# Patient Record
Sex: Female | Born: 1995 | Race: White | Hispanic: No | Marital: Single | Smoking: Current every day smoker
Health system: Southern US, Community
[De-identification: ages and names within clinical notes are randomized; demographics above are authoritative.]

## PROBLEM LIST (undated history)

## (undated) ENCOUNTER — Inpatient Hospital Stay: Payer: Self-pay

## (undated) DIAGNOSIS — F419 Anxiety disorder, unspecified: Secondary | ICD-10-CM

## (undated) DIAGNOSIS — F319 Bipolar disorder, unspecified: Secondary | ICD-10-CM

## (undated) DIAGNOSIS — F329 Major depressive disorder, single episode, unspecified: Secondary | ICD-10-CM

## (undated) DIAGNOSIS — G43909 Migraine, unspecified, not intractable, without status migrainosus: Secondary | ICD-10-CM

## (undated) DIAGNOSIS — F32A Depression, unspecified: Secondary | ICD-10-CM

## (undated) HISTORY — PX: NO PAST SURGERIES: SHX2092

## (undated) HISTORY — PX: OTHER SURGICAL HISTORY: SHX169

## (undated) HISTORY — DX: Migraine, unspecified, not intractable, without status migrainosus: G43.909

## (undated) HISTORY — PX: INTRAUTERINE DEVICE (IUD) INSERTION: SHX5877

---
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
## 2014-02-24 DIAGNOSIS — G43709 Chronic migraine without aura, not intractable, without status migrainosus: Secondary | ICD-10-CM | POA: Diagnosis present

## 2014-02-24 DIAGNOSIS — IMO0002 Reserved for concepts with insufficient information to code with codable children: Secondary | ICD-10-CM | POA: Diagnosis present

## 2014-08-31 NOTE — Consult Note (Signed)
Referral Information:   Reason for Referral Abnormal thyroid function testing and family history of thyroid cancer    Referring Physician [facility]    Prenatal Hx Ms. Cliffton AstersWhite is 19 year old G2P0010 with EDC of 09/14/2012 (16 3/7 weeks) who I was asked to see secondary to having abnormal TFTs. These tests were done because the patient has been having some light-headedness. Of note, the patient's mother had thyroid cancer. On 03/07/2012: TSH = 0.328 (0.450-4.500) reported as low Thryoxine (T4) = 12.1 (4.5-12.0) reported as high (of note, this was total T4) T3 Uptake = 18% (23-35%) reported as low Free Thryroxine Index = 2.2 (1.2-4.9) reported as normal   Home Medications: Medication Instructions Status  Zofran 8 mg oral tablet tab(s) orally , As Needed- for Nausea, Vomiting  Active   Allergies:   Latex: Blisters  Vital Signs/Notes:  Nursing Vital Signs: **Vital Signs.:   [id number]:   18-Nov-13 08:53   Vital Signs Type Routine   Temperature Temperature (F) 97.2   Celsius 36.2   Pulse Pulse 93   Respirations Respirations 16   Systolic BP Systolic BP 108   Diastolic BP (mmHg) Diastolic BP (mmHg) 64   Mean BP 78   Impression/Recommendations:   Impression Normal thyroid testing for pregnancy. With pregnancy, thyroid binding globulin (TBG) increases. With this, a pregnant women will make more total T4, so that free T4 is normal. Ms. Cliffton AstersWhite has a slightly increased total T4 and a depressed T3 Uptake. T3 Uptake is an inverse measurement of TBG. As TBG increases T3 Uptake decreases. The free thyroxine index is the result of the the interaction and total T4 and T3 Uptake. It is a reflection of free T4. Ms. Cliffton AstersWhite has a normal free thyroxine index.  Also, in pregnancy, the TSH can be slightly depressed. Alpha HCG and alpha TSH are the same molecule. Alpha HCG has a little stimulating effect on the thyroid, so we can see TSH be slightly depressed in pregnancy.    Recommendations No recommendations are  needed for this issue.     Total Time Spent with Patient 15 minutes    >50% of visit spent in couseling/coordination of care no    Office Use Only [address]  Level 1 (15min) NEW office consult prob focused   Coding Description: MATERNAL CONDITIONS/HISTORY INDICATION(S).   Thyroid dysfunction in pregnancy.  Electronic Signatures: Marcelino ScotBrancazio, Doren Kaspar (MD)  (Signed [phone]-Nov-13 09:42)  Authored: Referral, Home Medications, Allergies, Vital Signs/Notes, Impression, Billing, Coding Description   Last Updated: 18-Nov-13 09:42 by Marcelino ScotBrancazio, Maelani Yarbro (MD)
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
## 2015-10-06 ENCOUNTER — Encounter: Payer: Self-pay | Admitting: Emergency Medicine

## 2015-10-06 ENCOUNTER — Ambulatory Visit
Admission: EM | Admit: 2015-10-06 | Discharge: 2015-10-06 | Disposition: A | Discharge status: Home / Self Care | Payer: Worker's Compensation | Attending: Emergency Medicine | Admitting: Emergency Medicine

## 2015-10-06 ENCOUNTER — Ambulatory Visit (INDEPENDENT_AMBULATORY_CARE_PROVIDER_SITE_OTHER): Payer: Worker's Compensation

## 2015-10-06 DIAGNOSIS — S8392XA Sprain of unspecified site of left knee, initial encounter: Secondary | ICD-10-CM | POA: Diagnosis not present

## 2015-10-06 MED ORDER — HYDROCODONE-ACETAMINOPHEN 5-325 MG PO TABS: 1.0000 | ORAL_TABLET | ORAL | Status: DC | PRN

## 2015-10-06 MED ORDER — IBUPROFEN 800 MG PO TABS: 800.0000 mg | ORAL_TABLET | Freq: Three times a day (TID) | ORAL | Status: DC | PRN

## 2015-10-06 NOTE — ED Notes (Signed)
Pt reports tripped and fell while working Tuesday and continues to have right knee pain and swelling

## 2015-10-06 NOTE — Discharge Instructions (Signed)
Follow up with Occupational Health within the next day or two. Go to the ER for the signs and symptoms we discussed.

## 2015-10-06 NOTE — ED Provider Notes (Addendum)
HPI  SUBJECTIVE:  Laura Pitts is a 20 y.o. female who presents with throbbing, sharp, constant right knee pain. Patient states that she her foot slipped out from underneath her 2 days ago, she hyperextended her knee. States that she twisted to the right. She heard her knee "pop", but it was not immediately followed by pain or swelling until later. No redness, numbness, tingling, bruising, deformity. Symptoms are worse with weightbearing, better with rest, nonweightbearing, ibuprofen, knee brace. She has been wearing her fianc's knee brace, tried 2 tabs of Tylenol, 400 mg ibuprofen. No antipyretic in the past 6-8 hours. No previous history of injury to this knee. She has past medical history of left patellar dislocation, no history of diabetes, hypertension. LMP: 5/21. PMD: Dr. Fulton Mole at [facility] primary care. This is a worker's comp injury.  No past medical history on file.  No past surgical history on file.  No family history on file.  Social History  Substance Use Topics  . Smoking status: Current Every Day Smoker -- 0.50 packs/day    Types: Cigarettes  . Smokeless tobacco: None  . Alcohol Use: No    No current facility-administered medications for this encounter.  Current outpatient prescriptions:  .  sertraline (ZOLOFT) 25 MG tablet, Take 25 mg by mouth daily., Disp: , Rfl:  .  HYDROcodone-acetaminophen (NORCO/VICODIN) 5-325 MG tablet, Take 1-2 tablets by mouth every 4 (four) hours as needed for moderate pain., Disp: 20 tablet, Rfl: 0 .  ibuprofen (ADVIL,MOTRIN) 800 MG tablet, Take 1 tablet (800 mg total) by mouth every 8 (eight) hours as needed., Disp: 30 tablet, Rfl: 0  Allergies  Allergen Reactions  . Latex      ROS  As noted in HPI.   Physical Exam  BP 115/74 mmHg  Pulse 76  Temp(Src) 97.8 F (36.6 C) (Tympanic)  Resp 16  Ht  (1.651 m)  Wt 150 lb (68.04 kg)  BMI 24.96 kg/m2  SpO2 99%  LMP 10/02/2015  Constitutional: Well developed, well nourished,  no acute distress Eyes:  EOMI, conjunctiva normal bilaterally HENT: Normocephalic, atraumatic,mucus membranes moist Respiratory: Normal inspiratory effort Cardiovascular: Normal rate GI: nondistended skin: No rash, skin intact Musculoskeletal: R Knee ROM baseline for PT,  Flexion/extension  Intact, Patella NT, Patellar apprehension test negative, Patellar tendon tender, Medial joint NT, Lateral joint  tender, Popliteal region NT, Lachman's stable, Varus LCL stress testing stable, Valgus MCL stress testing stable, McMurray's testing  abnormal, distal NVI with intact baseline sensation / motor / pulse distal to knee affected extremity. No erythema, bruising, effusion. Sensation distally grossly intact. PT pulses 2+ equal bilaterally Neurologic: Alert & oriented x 3, no focal neuro deficits Psychiatric: Speech and behavior appropriate   ED Course   Medications - No data to display  Orders Placed This Encounter  Procedures  . DG Knee Complete 4 Views Right    Standing Status: Standing     Number of Occurrences: 1     Standing Expiration Date:     Order Specific Question:  Reason for Exam (SYMPTOM  OR DIAGNOSIS REQUIRED)    Answer:  r/o fx dislocation effusion  . Ambulatory referral to Occupational Therapy    Referral Priority:  Urgent    Referral Type:  Occupational Therapy    Referral Reason:  Specialty Services Required    Requested Specialty:  Occupational Therapy    Number of Visits Requested:  1    No results found for this or any previous visit (from the past  24 hour(s)). Dg Knee Complete 4 Views Right  10/06/2015  CLINICAL DATA:  Slipped and fell 3 days ago, twisted knee EXAM: RIGHT KNEE - COMPLETE 4+ VIEW COMPARISON:  04/04/2011 FINDINGS: Four views of the right knee submitted. No acute fracture or subluxation. There is minimal narrowing of medial joint compartment. Small joint effusion. No radiopaque foreign body. IMPRESSION: No acute fracture or subluxation. Minimal narrowing  of medial joint compartment. Small joint effusion. Electronically Signed   By: Natasha MeadLiviu  Pop M.D.   On: 10/06/2015 13:21    ED Clinical Impression  Knee sprain and strain, left, initial encounter   ED Assessment/Plan  Reviewed imaging independently. No fracture or subluxation. Mild narrowing of medial joint compartment, small joint effusion per radiology. See radiology report for details.   We'll make sure that patient's brace fits her knee, if it doesn't, we will provide appropriately fitting brace. Home with ibuprofen, tramadol, ice, rest. Filled out worker's comp paperwork for limited activity for one week. She is to follow-up with Hassell Halimommy Ann Moore at occupational health.  Discussed imaging, MDM, plan and followup with patient. Discussed sn/sx that should prompt return to the ED. Patient agrees with plan.   *This clinic note was created using Dragon dictation software. Therefore, there may be occasional mistakes despite careful proofreading.  ?   Domenick GongAshley Osmin Welz, MD 10/06/15 1348  Domenick GongAshley Lorie Cleckley, MD 10/06/15 [phone]

## 2015-11-16 ENCOUNTER — Other Ambulatory Visit: Payer: Self-pay | Admitting: Family

## 2015-11-16 DIAGNOSIS — M2240 Chondromalacia patellae, unspecified knee: Secondary | ICD-10-CM | POA: Insufficient documentation

## 2015-12-16 LAB — HM PAP SMEAR

## 2016-01-13 ENCOUNTER — Encounter: Payer: Self-pay | Admitting: Emergency Medicine

## 2016-01-13 ENCOUNTER — Emergency Department
Admission: EM | Admit: 2016-01-13 | Discharge: 2016-01-14 | Disposition: A | Discharge status: Home / Self Care | Payer: Medicaid Other | Attending: Student in an Organized Health Care Education/Training Program | Admitting: Student in an Organized Health Care Education/Training Program

## 2016-01-13 DIAGNOSIS — F32A Depression, unspecified: Secondary | ICD-10-CM

## 2016-01-13 DIAGNOSIS — F329 Major depressive disorder, single episode, unspecified: Secondary | ICD-10-CM | POA: Insufficient documentation

## 2016-01-13 DIAGNOSIS — Z79899 Other long term (current) drug therapy: Secondary | ICD-10-CM | POA: Diagnosis not present

## 2016-01-13 DIAGNOSIS — F17210 Nicotine dependence, cigarettes, uncomplicated: Secondary | ICD-10-CM | POA: Insufficient documentation

## 2016-01-13 DIAGNOSIS — Z046 Encounter for general psychiatric examination, requested by authority: Secondary | ICD-10-CM | POA: Diagnosis present

## 2016-01-13 DIAGNOSIS — F1290 Cannabis use, unspecified, uncomplicated: Secondary | ICD-10-CM | POA: Diagnosis not present

## 2016-01-13 LAB — CBC
HCT: 41.9 % (ref 35.0–47.0)
Hemoglobin: 14.7 g/dL (ref 12.0–16.0)
MCH: 31.5 pg (ref 26.0–34.0)
MCHC: 35.1 g/dL (ref 32.0–36.0)
MCV: 89.7 fL (ref 80.0–100.0)
Platelets: 252 10*3/uL (ref 150–440)
RBC: 4.67 MIL/uL (ref 3.80–5.20)
RDW: 13.2 % (ref 11.5–14.5)
WBC: 8.9 10*3/uL (ref 3.6–11.0)

## 2016-01-13 LAB — COMPREHENSIVE METABOLIC PANEL
ALT: 11 U/L — ABNORMAL LOW (ref 14–54)
AST: 15 U/L (ref 15–41)
Albumin: 4.7 g/dL (ref 3.5–5.0)
Alkaline Phosphatase: 43 U/L (ref 38–126)
Anion gap: 6 (ref 5–15)
BUN: 11 mg/dL (ref 6–20)
CO2: 29 mmol/L (ref 22–32)
Calcium: 9.1 mg/dL (ref 8.9–10.3)
Chloride: 104 mmol/L (ref 101–111)
Creatinine, Ser: 0.79 mg/dL (ref 0.44–1.00)
GFR calc Af Amer: >60 mL/min (ref >60)
GFR calc non Af Amer: >60 mL/min (ref >60)
Glucose, Bld: 108 mg/dL — ABNORMAL HIGH (ref 65–99)
Potassium: 3.4 mmol/L — ABNORMAL LOW (ref 3.5–5.1)
Sodium: 139 mmol/L (ref 135–145)
Total Bilirubin: 0.2 mg/dL — ABNORMAL LOW (ref 0.3–1.2)
Total Protein: 7.9 g/dL (ref 6.5–8.1)

## 2016-01-13 LAB — ETHANOL: Alcohol, Ethyl (B): <5 mg/dL (ref <5)

## 2016-01-13 LAB — URINE DRUG SCREEN, QUALITATIVE (ARMC ONLY)
Amphetamines, Ur Screen: NOT DETECTED
Barbiturates, Ur Screen: POSITIVE — AB
Benzodiazepine, Ur Scrn: POSITIVE — AB
Cannabinoid 50 Ng, Ur ~~LOC~~: POSITIVE — AB
Cocaine Metabolite,Ur ~~LOC~~: NOT DETECTED
MDMA (Ecstasy)Ur Screen: NOT DETECTED
Methadone Scn, Ur: NOT DETECTED
Opiate, Ur Screen: POSITIVE — AB
Phencyclidine (PCP) Ur S: NOT DETECTED
Tricyclic, Ur Screen: NOT DETECTED

## 2016-01-13 LAB — POCT PREGNANCY, URINE: Preg Test, Ur: NEGATIVE

## 2016-01-13 LAB — SALICYLATE LEVEL: Salicylate Lvl: <4 mg/dL (ref 2.8–30.0)

## 2016-01-13 LAB — ACETAMINOPHEN LEVEL: Acetaminophen (Tylenol), Serum: <10 ug/mL — ABNORMAL LOW (ref 10–30)

## 2016-01-13 NOTE — ED Provider Notes (Signed)
[facility] Emergency Department Provider Note    First MD Initiated Contact with Patient 01/13/16 2010     (approximate)  I have reviewed the triage vital signs and the nursing notes.   HISTORY  Chief Complaint Psychiatric Evaluation    HPI Laura Pitts is a 20 y.o. female who presents due to severe depression. Patient states that she feels that she's not being a good mother 89 or 31-year-old. Recently broke up with her boyfriend has been living out of her car. States that she has thoughts of "wanting to end it." But denies any plan on how she would hurt herself or kill herself. Denies any audio or visual hallucinations. Denies any recent changes to her medications. Is currently asking for pain medication for her low back pain and knee pain.   History reviewed. No pertinent past medical history.  There are no active problems to display for this patient.   History reviewed. No pertinent surgical history.  Prior to Admission medications   Medication Sig Start Date End Date Taking? Authorizing Provider  HYDROcodone-acetaminophen (NORCO/VICODIN) 5-325 MG tablet Take 1-2 tablets by mouth every 4 (four) hours as needed for moderate pain. 10/06/15   Domenick Gong, MD  ibuprofen (ADVIL,MOTRIN) 800 MG tablet Take 1 tablet (800 mg total) by mouth every 8 (eight) hours as needed. 10/06/15   Domenick Gong, MD  sertraline (ZOLOFT) 25 MG tablet Take 25 mg by mouth daily.    Historical Provider, MD    Allergies Latex  History reviewed. No pertinent family history.  Social History Social History  Substance Use Topics  . Smoking status: Current Every Day Smoker    Packs/day: 0.50    Types: Cigarettes  . Smokeless tobacco: Never Used  . Alcohol use Yes    Review of Systems Patient denies headaches, rhinorrhea, blurry vision, numbness, shortness of breath, chest pain, edema, cough, abdominal pain, nausea, vomiting, diarrhea, dysuria, fevers, rashes or  hallucinations unless otherwise stated above in HPI. ____________________________________________   PHYSICAL EXAM:  VITAL SIGNS: Vitals:   01/13/16 1945  BP: 130/72  Pulse: 94  Resp: 16  Temp: 98.9 F (37.2 C)    Constitutional: Alert and oriented. Well appearing and in no acute distress. Eyes: Conjunctivae are normal. PERRL. EOMI. Head: Atraumatic. Nose: No congestion/rhinnorhea. Mouth/Throat: Mucous membranes are moist.  Oropharynx non-erythematous. Neck: No stridor. Painless ROM. No cervical spine tenderness to palpation Hematological/Lymphatic/Immunilogical: No cervical lymphadenopathy. Cardiovascular: Normal rate, regular rhythm. Grossly normal heart sounds.  Good peripheral circulation. Respiratory: Normal respiratory effort.  No retractions. Lungs CTAB. Gastrointestinal: Soft and nontender. No distention. No abdominal bruits. No CVA tenderness. Genitourinary:  Musculoskeletal: No lower extremity tenderness nor edema.  No joint effusions. Neurologic:  Normal speech and language. No gross focal neurologic deficits are appreciated. No gait instability. Skin:  Skin is warm, dry and intact. No rash noted. Psychiatric: Mood and affect are normal. Speech and behavior are normal.  ____________________________________________   LABS (all labs ordered are listed, but only abnormal results are displayed)  Results for orders placed or performed during the hospital encounter of 01/13/16 (from the past 24 hour(s))  Comprehensive metabolic panel     Status: Abnormal   Collection Time: 01/13/16  7:51 PM  Result Value Ref Range   Sodium 139 135 - 145 mmol/L   Potassium 3.4 (L) 3.5 - 5.1 mmol/L   Chloride 104 101 - 111 mmol/L   CO2 29 22 - 32 mmol/L   Glucose, Bld 108 (H) 65 -  99 mg/dL   BUN 11 6 - 20 mg/dL   Creatinine, Ser 2.840.79 0.44 - 1.00 mg/dL   Calcium 9.1 8.9 - 13.210.3 mg/dL   Total Protein 7.9 6.5 - 8.1 g/dL   Albumin 4.7 3.5 - 5.0 g/dL   AST 15 15 - 41 U/L   ALT 11 (L)  14 - 54 U/L   Alkaline Phosphatase 43 38 - 126 U/L   Total Bilirubin 0.2 (L) 0.3 - 1.2 mg/dL   GFR calc non Af Amer >60 >60 mL/min   GFR calc Af Amer >60 >60 mL/min   Anion gap 6 5 - 15  Ethanol     Status: None   Collection Time: 01/13/16  7:51 PM  Result Value Ref Range   Alcohol, Ethyl (B) <5 <5 mg/dL  Salicylate level     Status: None   Collection Time: 01/13/16  7:51 PM  Result Value Ref Range   Salicylate Lvl <4.0 2.8 - 30.0 mg/dL  Acetaminophen level     Status: Abnormal   Collection Time: 01/13/16  7:51 PM  Result Value Ref Range   Acetaminophen (Tylenol), Serum <10 (L) 10 - 30 ug/mL  cbc     Status: None   Collection Time: 01/13/16  7:51 PM  Result Value Ref Range   WBC 8.9 3.6 - 11.0 K/uL   RBC 4.67 3.80 - 5.20 MIL/uL   Hemoglobin 14.7 12.0 - 16.0 g/dL   HCT 44.041.9 10.235.0 - 72.547.0 %   MCV 89.7 80.0 - 100.0 fL   MCH 31.5 26.0 - 34.0 pg   MCHC 35.1 32.0 - 36.0 g/dL   RDW 36.613.2 44.011.5 - 34.714.5 %   Platelets 252 150 - 440 K/uL  Urine Drug Screen, Qualitative     Status: Abnormal   Collection Time: 01/13/16  7:52 PM  Result Value Ref Range   Tricyclic, Ur Screen NONE DETECTED NONE DETECTED   Amphetamines, Ur Screen NONE DETECTED NONE DETECTED   MDMA (Ecstasy)Ur Screen NONE DETECTED NONE DETECTED   Cocaine Metabolite,Ur Highfill NONE DETECTED NONE DETECTED   Opiate, Ur Screen POSITIVE (A) NONE DETECTED   Phencyclidine (PCP) Ur S NONE DETECTED NONE DETECTED   Cannabinoid 50 Ng, Ur Dana POSITIVE (A) NONE DETECTED   Barbiturates, Ur Screen POSITIVE (A) NONE DETECTED   Benzodiazepine, Ur Scrn POSITIVE (A) NONE DETECTED   Methadone Scn, Ur NONE DETECTED NONE DETECTED  Pregnancy, urine POC     Status: None   Collection Time: 01/13/16  8:06 PM  Result Value Ref Range   Preg Test, Ur NEGATIVE NEGATIVE   ____________________________________________  ____________________________  RADIOLOGY   ____________________________________________   PROCEDURES  Procedure(s) performed:  none    Critical Care performed: no ____________________________________________   INITIAL IMPRESSION / ASSESSMENT AND PLAN / ED COURSE  Pertinent labs & imaging results that were available during my care of the patient were reviewed by me and considered in my medical decision making (see chart for details).  DDX: Psychosis, delirium, medication effect, noncompliance, polysubstance abuse, Si, Hi, depression    Laura Pitts is a 20 y.o. who presents to the ED with for evaluation of severe depression.  Patient has psych history of depression.  Laboratory testing was ordered to evaluation for underlying electrolyte derangement or signs of underlying organic pathology to explain today's presentation.  + polysubstance abuse. Based on history and physical and laboratory evaluation, it appears that the patient's presentation is 2/2 underlying psychiatric disorder and Pitts require further evaluation and management by  inpatient psychiatry.  Patient was not made an IVC as she does demonstrate any active SI or Hi.  Pitts have patient evaluated by associated for any medication recommendations.  Patient signed out to oncoming physician Dr. Zenda Alpers   Clinical Course     ____________________________________________   FINAL CLINICAL IMPRESSION(S) / ED DIAGNOSES  Final diagnoses:  Depression      NEW MEDICATIONS STARTED DURING THIS VISIT:  New Prescriptions   No medications on file     Note:  This document was prepared using Dragon voice recognition software and may include unintentional dictation errors.    Willy Eddy, MD 01/14/16 [phone]

## 2016-01-13 NOTE — BH Assessment (Signed)
Assessment Note  Laura Pitts is an 20 y.o. female presenting to the ED with concerns of depression and suicidal ideations without intent.  Patient reports being feeling stressed and overwhelmed but did not wish to elaborate.  Patient did not admit to drug use but her UDS was positive for Benzos, opiates, cannabis and barbiturates. She denies any auditory/visual hallucinations.  Diagnosis: Depression  Past Medical History: History reviewed. No pertinent past medical history.  History reviewed. No pertinent surgical history.  Family History: History reviewed. No pertinent family history.  Social History:  reports that she has been smoking Cigarettes.  She has been smoking about 0.50 packs per day. She has never used smokeless tobacco. She reports that she drinks alcohol. She reports that she uses drugs, including Marijuana.  Additional Social History:  Alcohol / Drug Use History of alcohol / drug use?: No history of alcohol / drug abuse (Pt denies) Negative Consequences of Use: Personal relationships  CIWA: CIWA-Ar BP: 130/72 Pulse Rate: 94 COWS:    Allergies:  Allergies  Allergen Reactions  . Latex   . Venlafaxine Other (See Comments)    Too tired    Home Medications:  (Not in a hospital admission)  OB/GYN Status:  Patient's last menstrual period was 01/04/2016.  General Assessment Data Admission Status: Voluntary           Risk to self with the past 6 months Is patient at risk for suicide?: Yes                               Abuse/Neglect Assessment (Assessment to be complete while patient is alone) Physical Abuse: Denies Verbal Abuse: Denies Sexual Abuse: Denies Exploitation of patient/patient's resources: Denies Self-Neglect: Denies Values / Beliefs Cultural Requests During Hospitalization: None Spiritual Requests During Hospitalization: None Consults Spiritual Care Consult Needed: No Social Work Consult Needed: No             Disposition:     On Site Evaluation by:   Reviewed with Physician:    Artist Beachoxana C Phillip Sandler 01/13/2016 11:04 PM

## 2016-01-13 NOTE — ED Triage Notes (Signed)
Pt states she has felt depressed for several days. Pt states "sort of kind of" when asked if she feels suicidal. Pt tearful in triage. Pt denies HI.

## 2016-01-14 NOTE — ED Notes (Signed)
SOC att  

## 2016-09-20 ENCOUNTER — Encounter: Payer: Self-pay | Admitting: Emergency Medicine

## 2016-09-20 ENCOUNTER — Encounter: Payer: Self-pay | Admitting: Psychiatry

## 2016-09-20 ENCOUNTER — Emergency Department
Admission: EM | Admit: 2016-09-20 | Discharge: 2016-09-20 | Disposition: A | Discharge status: Other Acute Inpatient Hospital | Payer: Medicaid Other | Attending: Emergency Medicine | Admitting: Emergency Medicine

## 2016-09-20 ENCOUNTER — Inpatient Hospital Stay
Admission: RE | Admit: 2016-09-20 | Discharge: 2016-09-24 | DRG: 885 | Disposition: A | Discharge status: Home / Self Care | Payer: Medicaid Other | Source: Intra-hospital | Attending: Psychiatry | Admitting: Psychiatry

## 2016-09-20 DIAGNOSIS — F4310 Post-traumatic stress disorder, unspecified: Secondary | ICD-10-CM

## 2016-09-20 DIAGNOSIS — G43909 Migraine, unspecified, not intractable, without status migrainosus: Secondary | ICD-10-CM | POA: Diagnosis present

## 2016-09-20 DIAGNOSIS — Z79899 Other long term (current) drug therapy: Secondary | ICD-10-CM | POA: Diagnosis not present

## 2016-09-20 DIAGNOSIS — G4700 Insomnia, unspecified: Secondary | ICD-10-CM | POA: Diagnosis present

## 2016-09-20 DIAGNOSIS — R45851 Suicidal ideations: Secondary | ICD-10-CM | POA: Diagnosis present

## 2016-09-20 DIAGNOSIS — F4325 Adjustment disorder with mixed disturbance of emotions and conduct: Secondary | ICD-10-CM | POA: Diagnosis not present

## 2016-09-20 DIAGNOSIS — G8929 Other chronic pain: Secondary | ICD-10-CM | POA: Diagnosis present

## 2016-09-20 DIAGNOSIS — E538 Deficiency of other specified B group vitamins: Secondary | ICD-10-CM | POA: Diagnosis present

## 2016-09-20 DIAGNOSIS — F319 Bipolar disorder, unspecified: Secondary | ICD-10-CM | POA: Insufficient documentation

## 2016-09-20 DIAGNOSIS — F17210 Nicotine dependence, cigarettes, uncomplicated: Secondary | ICD-10-CM | POA: Diagnosis present

## 2016-09-20 DIAGNOSIS — G43709 Chronic migraine without aura, not intractable, without status migrainosus: Secondary | ICD-10-CM | POA: Diagnosis present

## 2016-09-20 DIAGNOSIS — F17200 Nicotine dependence, unspecified, uncomplicated: Secondary | ICD-10-CM

## 2016-09-20 DIAGNOSIS — F332 Major depressive disorder, recurrent severe without psychotic features: Secondary | ICD-10-CM | POA: Diagnosis present

## 2016-09-20 DIAGNOSIS — IMO0002 Reserved for concepts with insufficient information to code with codable children: Secondary | ICD-10-CM | POA: Diagnosis present

## 2016-09-20 HISTORY — DX: Bipolar disorder, unspecified: F31.9

## 2016-09-20 HISTORY — DX: Major depressive disorder, single episode, unspecified: F32.9

## 2016-09-20 HISTORY — DX: Anxiety disorder, unspecified: F41.9

## 2016-09-20 HISTORY — DX: Depression, unspecified: F32.A

## 2016-09-20 LAB — URINE DRUG SCREEN, QUALITATIVE (ARMC ONLY)
Amphetamines, Ur Screen: NOT DETECTED
Barbiturates, Ur Screen: NOT DETECTED
Benzodiazepine, Ur Scrn: NOT DETECTED
Cannabinoid 50 Ng, Ur ~~LOC~~: POSITIVE — AB
Cocaine Metabolite,Ur ~~LOC~~: NOT DETECTED
MDMA (Ecstasy)Ur Screen: NOT DETECTED
Methadone Scn, Ur: NOT DETECTED
Opiate, Ur Screen: NOT DETECTED
Phencyclidine (PCP) Ur S: NOT DETECTED
Tricyclic, Ur Screen: POSITIVE — AB

## 2016-09-20 LAB — COMPREHENSIVE METABOLIC PANEL
ALT: 14 U/L (ref 14–54)
AST: 19 U/L (ref 15–41)
Albumin: 4.1 g/dL (ref 3.5–5.0)
Alkaline Phosphatase: 49 U/L (ref 38–126)
Anion gap: 5 (ref 5–15)
BUN: 9 mg/dL (ref 6–20)
CO2: 28 mmol/L (ref 22–32)
Calcium: 9.3 mg/dL (ref 8.9–10.3)
Chloride: 108 mmol/L (ref 101–111)
Creatinine, Ser: 0.73 mg/dL (ref 0.44–1.00)
GFR calc Af Amer: >60 mL/min (ref >60)
GFR calc non Af Amer: >60 mL/min (ref >60)
Glucose, Bld: 101 mg/dL — ABNORMAL HIGH (ref 65–99)
Potassium: 3.7 mmol/L (ref 3.5–5.1)
Sodium: 141 mmol/L (ref 135–145)
Total Bilirubin: 0.7 mg/dL (ref 0.3–1.2)
Total Protein: 7.6 g/dL (ref 6.5–8.1)

## 2016-09-20 LAB — SALICYLATE LEVEL: Salicylate Lvl: <7 mg/dL (ref 2.8–30.0)

## 2016-09-20 LAB — CBC
HCT: 41.4 % (ref 35.0–47.0)
Hemoglobin: 14.3 g/dL (ref 12.0–16.0)
MCH: 30.7 pg (ref 26.0–34.0)
MCHC: 34.5 g/dL (ref 32.0–36.0)
MCV: 89 fL (ref 80.0–100.0)
Platelets: 251 10*3/uL (ref 150–440)
RBC: 4.65 MIL/uL (ref 3.80–5.20)
RDW: 12.3 % (ref 11.5–14.5)
WBC: 10.3 10*3/uL (ref 3.6–11.0)

## 2016-09-20 LAB — ETHANOL: Alcohol, Ethyl (B): <5 mg/dL (ref <5)

## 2016-09-20 LAB — ACETAMINOPHEN LEVEL: Acetaminophen (Tylenol), Serum: <10 ug/mL — ABNORMAL LOW (ref 10–30)

## 2016-09-20 MED ORDER — ACETAMINOPHEN 325 MG PO TABS
650.0000 mg | ORAL_TABLET | Freq: Four times a day (QID) | ORAL | Status: DC | PRN
  Administered 2016-09-23: 650 mg via ORAL
  Filled 2016-09-20: qty 2

## 2016-09-20 MED ORDER — MELOXICAM 7.5 MG PO TABS: 15.0000 mg | ORAL_TABLET | Freq: Every day | ORAL | Status: DC

## 2016-09-20 MED ORDER — ACETAMINOPHEN 325 MG PO TABS
650.0000 mg | ORAL_TABLET | Freq: Once | ORAL | Status: AC
  Administered 2016-09-20: 650 mg via ORAL

## 2016-09-20 MED ORDER — LAMOTRIGINE 25 MG PO TABS
50.0000 mg | ORAL_TABLET | Freq: Two times a day (BID) | ORAL | Status: DC
  Administered 2016-09-20 – 2016-09-24 (×8): 50 mg via ORAL
  Filled 2016-09-20 (×8): qty 2

## 2016-09-20 MED ORDER — MELOXICAM 7.5 MG PO TABS
15.0000 mg | ORAL_TABLET | Freq: Every day | ORAL | Status: DC
  Administered 2016-09-21 – 2016-09-24 (×4): 15 mg via ORAL
  Filled 2016-09-20 (×5): qty 2

## 2016-09-20 MED ORDER — VENLAFAXINE HCL ER 75 MG PO CP24: 75.0000 mg | ORAL_CAPSULE | Freq: Every day | ORAL | Status: DC

## 2016-09-20 MED ORDER — VENLAFAXINE HCL ER 75 MG PO CP24
75.0000 mg | ORAL_CAPSULE | Freq: Every day | ORAL | Status: DC
  Administered 2016-09-21: 75 mg via ORAL
  Filled 2016-09-20: qty 1

## 2016-09-20 MED ORDER — AMITRIPTYLINE HCL 25 MG PO TABS
25.0000 mg | ORAL_TABLET | Freq: Every day | ORAL | Status: DC
  Filled 2016-09-20: qty 1

## 2016-09-20 MED ORDER — ACETAMINOPHEN 325 MG PO TABS
650.0000 mg | ORAL_TABLET | Freq: Once | ORAL | Status: DC
  Filled 2016-09-20: qty 2

## 2016-09-20 MED ORDER — MAGNESIUM HYDROXIDE 400 MG/5ML PO SUSP: 30.0000 mL | Freq: Every day | ORAL | Status: DC | PRN

## 2016-09-20 MED ORDER — TRAZODONE HCL 100 MG PO TABS: 100.0000 mg | ORAL_TABLET | Freq: Every evening | ORAL | Status: DC | PRN

## 2016-09-20 MED ORDER — ALUM & MAG HYDROXIDE-SIMETH 200-200-20 MG/5ML PO SUSP: 30.0000 mL | ORAL | Status: DC | PRN

## 2016-09-20 MED ORDER — HYDROXYZINE HCL 25 MG PO TABS: 25.0000 mg | ORAL_TABLET | Freq: Three times a day (TID) | ORAL | Status: DC | PRN

## 2016-09-20 MED ORDER — AMITRIPTYLINE HCL 25 MG PO TABS
25.0000 mg | ORAL_TABLET | Freq: Every day | ORAL | Status: DC
  Administered 2016-09-20 – 2016-09-21 (×2): 25 mg via ORAL
  Filled 2016-09-20 (×2): qty 1

## 2016-09-20 MED ORDER — LAMOTRIGINE 25 MG PO TABS: 50.0000 mg | ORAL_TABLET | Freq: Two times a day (BID) | ORAL | Status: DC

## 2016-09-20 NOTE — ED Notes (Signed)
Pt. Alert and oriented, warm and dry, in no distress. Pt. Denies SI, HI, and AVH. Pt states was having SI earlier but not currently. Pt. Encouraged to let nursing staff know of any concerns or needs.
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
## 2016-09-20 NOTE — ED Notes (Signed)
[facility] Patient in agreement with transfer. Patient denies SI/HI/AVH and pain.

## 2016-09-20 NOTE — ED Notes (Signed)
Patient is sleeping, Patient's boyfriend called and states " oh let her sleep, she needs it' patient without signs of distress.
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
## 2016-09-20 NOTE — ED Notes (Signed)
Dr. Clapacs talking with Patient.  

## 2016-09-20 NOTE — ED Notes (Signed)
[facility] by Dr. Toni Amendlapacs.  Attending Physician will be Dr. Ardyth HarpsHernandez.   Patient has been [facility] room 324, by [facility] Charge Nurse [place].   Intake Paper Work has been signed and placed on patient chart.  ER staff is aware of the admission Irving Burton( [facility] ER MD; Toniann FailWendy Patient's Nurse & Mya Patient Access).

## 2016-09-20 NOTE — ED Notes (Signed)
Patient refused snack.  

## 2016-09-20 NOTE — ED Notes (Signed)
Patient offered dinner tray and she states she wanted to wait until later, beverage given to Patient./

## 2016-09-20 NOTE — ED Triage Notes (Signed)
Pt admits to SI with plan of overdosing with pills. Denies HI or hallucinations. Has psych hx. Cooperative and calm in triage.

## 2016-09-20 NOTE — Tx Team (Signed)
Initial Treatment Plan 09/20/2016 11:21 PM Laura Pitts [id number]    PATIENT STRESSORS: Loss of relationship     PATIENT STRENGTHS: Average or above average intelligence Communication skills General fund of knowledge Motivation for treatment/growth   PATIENT IDENTIFIED PROBLEMS: Depression  Anxiety  Suicidal thoughts                 DISCHARGE CRITERIA:  Improved stabilization in mood, thinking, and/or behavior Motivation to continue treatment in a less acute level of care Safe-care adequate arrangements made Verbal commitment to aftercare and medication compliance  PRELIMINARY DISCHARGE PLAN: Outpatient therapy Placement in alternative living arrangements  PATIENT/FAMILY INVOLVEMENT: This treatment plan has been presented to and reviewed with the patient, Laura Pitts.  The patient has been given the opportunity to ask questions and make suggestions.   Olin PiaByungura, Raeanne Deschler, RN 09/20/2016, 11:21 PM

## 2016-09-20 NOTE — ED Notes (Signed)
Patient is alert and oriented, states that she and her boyfriend just broke up a week ago, and she has always been depressed, but she became suicidal recently and knew she needed help, Patient states " I feel safe here, but I know if I leave I don't know what I will do" Patient states that her mom is supportive and her boyfriend is too now. Patient with q 15 minute checks and camera surveillance in progress.

## 2016-09-20 NOTE — ED Notes (Signed)
Report called to Laura Pitts in [facility].  Pt calm and cooperative.

## 2016-09-20 NOTE — ED Notes (Signed)
Pt transferred to [facility] with nurse and ods officer.

## 2016-09-20 NOTE — ED Provider Notes (Signed)
[facility] Emergency Department Provider Note       Time seen: ----------------------------------------- 2:47 PM on 09/20/2016 -----------------------------------------     I have reviewed the triage vital signs and the nursing notes.   HISTORY   Chief Complaint Suicidal    HPI Laura Pitts is a 21 y.o. female who presents to the ED for suicidal ideation today. Patient states she has a plan of overdosing with any pills that she can find. She denies hallucinations or homicidal ideation. She has a history of anxiety and bipolar disorder with depression. Patient reports she is taking her medications as prescribed. She is undergoing a lot of stress right now.   Past Medical History:  Diagnosis Date  . Anxiety   . Bipolar affective disorder (HCC)   . Depression     There are no active problems to display for this patient.   History reviewed. No pertinent surgical history.  Allergies Latex and Venlafaxine  Social History Social History  Substance Use Topics  . Smoking status: Current Every Day Smoker    Packs/day: 0.50    Types: Cigarettes  . Smokeless tobacco: Never Used  . Alcohol use Yes    Review of Systems Constitutional: Negative for fever. Eyes: Negative for vision changes ENT:  Negative for congestion, sore throat Cardiovascular: Negative for chest pain. Respiratory: Negative for shortness of breath. Gastrointestinal: Negative for abdominal pain, vomiting and diarrhea. Genitourinary: Negative for dysuria. Musculoskeletal: Negative for back pain. Skin: Negative for rash. Neurological: Negative for headaches, focal weakness or numbness. Psychiatric: Positive for suicidal ideation, depression  All systems negative/normal/unremarkable except as stated in the HPI  ____________________________________________   PHYSICAL EXAM:  VITAL SIGNS: ED Triage Vitals [09/20/16 1413]  Enc Vitals Group     BP      Pulse Rate 94      Resp 16     Temp 98.6 F (37 C)     Temp Source Oral     SpO2 97 %     Weight 154 lb (69.9 kg)     Height 5\' 5"  (1.651 m)     Head Circumference      Peak Flow      Pain Score      Pain Loc      Pain Edu?      Excl. in GC?     Constitutional: Alert and oriented. Well appearing and in no distress. Eyes: Conjunctivae are normal. PERRL. Normal extraocular movements. ENT   Head: Normocephalic and atraumatic.   Nose: No congestion/rhinnorhea.   Mouth/Throat: Mucous membranes are moist.   Neck: No stridor. Cardiovascular: Normal rate, regular rhythm. No murmurs, rubs, or gallops. Respiratory: Normal respiratory effort without tachypnea nor retractions. Breath sounds are clear and equal bilaterally. No wheezes/rales/rhonchi. Gastrointestinal: Soft and nontender. Normal bowel sounds Musculoskeletal: Nontender with normal range of motion in extremities. No lower extremity tenderness nor edema. Neurologic:  Normal speech and language. No gross focal neurologic deficits are appreciated.  Skin:  Skin is warm, dry and intact. No rash noted. Psychiatric: Depressed mood and affect ____________________________________________  ED COURSE:  Pertinent labs & imaging results that were available during my care of the patient were reviewed by me and considered in my medical decision making (see chart for details). Patient presents for suicidal ideation, we will assess with labs and consult psychiatry as indicated   Procedures ____________________________________________   LABS (pertinent positives/negatives)  Labs Reviewed  COMPREHENSIVE METABOLIC PANEL - Abnormal; Notable for the following:  Result Value   Glucose, Bld 101 (*)    All other components within normal limits  CBC  ETHANOL  SALICYLATE LEVEL  ACETAMINOPHEN LEVEL  URINE DRUG SCREEN, QUALITATIVE ([facility] ONLY)  POC URINE PREG, ED   __________________________________________  FINAL ASSESSMENT AND  PLAN  Suicidal ideation  Plan: Patient's labs were dictated above. Patient had presented for depression and suicidal thought. She is stable for psychiatric evaluation and likely admission.   Emily FilbertWilliams, Franca Stakes E, MD   Note: This note was generated in part or whole with voice recognition software. Voice recognition is usually quite accurate but there are transcription errors that can and very often do occur. I apologize for any typographical errors that were not detected and corrected.     Emily FilbertWilliams, Meoshia Billing E, MD 09/20/16 [phone]

## 2016-09-20 NOTE — ED Notes (Signed)
PT VOL/SEEN BY DR CLAPACS/RECOMMENDS PSYCHIATRIC INPATIENT ADMISSION WHEN MEDICALLY CLEARED.

## 2016-09-20 NOTE — ED Notes (Signed)
Pt dressed out into appropriate behavioral health clothing. Pt belongings consist of a white tongue ring, a white nose ring with clear stone, 2 white earrings with a clear stones, a white navel ring with clear stones, a white bar for cartilage, a white hoop with 2 balls, two pink hair bows, a white necklace with two charms one charm of a thumbprint and the other with three clear stones, a black headband, four black bobby pins, a black hair bow, brown flip flops, tan pants, a green shirt, a pink bra and pink panties. Pt calm and cooperative while dressing out.

## 2016-09-21 ENCOUNTER — Encounter: Payer: Self-pay | Admitting: Psychiatry

## 2016-09-21 DIAGNOSIS — F17200 Nicotine dependence, unspecified, uncomplicated: Secondary | ICD-10-CM

## 2016-09-21 DIAGNOSIS — F332 Major depressive disorder, recurrent severe without psychotic features: Principal | ICD-10-CM

## 2016-09-21 DIAGNOSIS — F4310 Post-traumatic stress disorder, unspecified: Secondary | ICD-10-CM

## 2016-09-21 LAB — LIPID PANEL
Cholesterol: 222 mg/dL — ABNORMAL HIGH (ref 0–200)
HDL: 40 mg/dL — ABNORMAL LOW (ref >40)
LDL Cholesterol: 135 mg/dL — ABNORMAL HIGH (ref 0–99)
Total CHOL/HDL Ratio: 5.6 RATIO
Triglycerides: 233 mg/dL — ABNORMAL HIGH (ref <150)
VLDL: 47 mg/dL — ABNORMAL HIGH (ref 0–40)

## 2016-09-21 LAB — TSH: TSH: 0.292 u[IU]/mL — ABNORMAL LOW (ref 0.350–4.500)

## 2016-09-21 LAB — PREGNANCY, URINE: Preg Test, Ur: NEGATIVE

## 2016-09-21 MED ORDER — VENLAFAXINE HCL ER 75 MG PO CP24
150.0000 mg | ORAL_CAPSULE | Freq: Every day | ORAL | Status: DC
  Administered 2016-09-22 – 2016-09-24 (×3): 150 mg via ORAL
  Filled 2016-09-21 (×3): qty 2

## 2016-09-21 MED ORDER — NICOTINE 21 MG/24HR TD PT24
21.0000 mg | MEDICATED_PATCH | Freq: Every day | TRANSDERMAL | Status: DC
  Administered 2016-09-21 – 2016-09-24 (×4): 21 mg via TRANSDERMAL
  Filled 2016-09-21 (×4): qty 1

## 2016-09-21 MED ORDER — METHOCARBAMOL 500 MG PO TABS
500.0000 mg | ORAL_TABLET | Freq: Two times a day (BID) | ORAL | Status: DC
  Administered 2016-09-21 – 2016-09-22 (×3): 500 mg via ORAL
  Filled 2016-09-21 (×3): qty 1

## 2016-09-21 MED ORDER — SUMATRIPTAN SUCCINATE 50 MG PO TABS: 50.0000 mg | ORAL_TABLET | ORAL | Status: DC | PRN

## 2016-09-21 NOTE — Tx Team (Signed)
Interdisciplinary Treatment and Diagnostic Plan Update  09/21/2016 Time of Session: [place] MRN: [id number]  Principal Diagnosis: MDD (major depressive disorder), recurrent episode, severe ([place])  Secondary Diagnoses: Principal Problem:   MDD (major depressive disorder), recurrent episode, severe ([place]) Active Problems:   Chronic migraine   PTSD (post-traumatic stress disorder)   Tobacco use disorder   Current Medications:  Current Facility-Administered Medications  Medication Dose Route Frequency Provider Last Rate Last Dose  . acetaminophen (TYLENOL) tablet 650 mg  650 mg Oral Q6H PRN Clapacs, John T, MD      . alum & mag hydroxide-simeth (MAALOX/MYLANTA) 200-200-20 MG/5ML suspension 30 mL  30 mL Oral Q4H PRN Clapacs, John T, MD      . amitriptyline (ELAVIL) tablet 25 mg  25 mg Oral QHS Clapacs, Madie Reno, MD   25 mg at 09/20/16 2236  . lamoTRIgine (LAMICTAL) tablet 50 mg  50 mg Oral BID Clapacs, Madie Reno, MD   50 mg at 09/21/16 0917  . magnesium hydroxide (MILK OF MAGNESIA) suspension 30 mL  30 mL Oral Daily PRN Clapacs, John T, MD      . meloxicam (MOBIC) tablet 15 mg  15 mg Oral Daily Clapacs, Madie Reno, MD   15 mg at 09/21/16 0917  . methocarbamol (ROBAXIN) tablet 500 mg  500 mg Oral BID Hildred Priest, MD   500 mg at 09/21/16 1144  . nicotine (NICODERM CQ - dosed in mg/24 hours) patch 21 mg  21 mg Transdermal Daily Hildred Priest, MD   21 mg at 09/21/16 1144  . SUMAtriptan (IMITREX) tablet 50 mg  50 mg Oral Q2H PRN Hildred Priest, MD      . Derrill Memo ON 09/22/2016] venlafaxine XR (EFFEXOR-XR) 24 hr capsule 150 mg  150 mg Oral Q breakfast Hildred Priest, MD       PTA Medications: Prescriptions Prior to Admission  Medication Sig Dispense Refill Last Dose  . amitriptyline (ELAVIL) 25 MG tablet Take 25 mg by mouth at bedtime.   unknown at unknown  . lamoTRIgine (LAMICTAL) 25 MG tablet Take 50 mg by mouth 2 (two) times daily.    unknown at unknown  . meloxicam (MOBIC) 15 MG tablet Take 15 mg by mouth daily. Take 1 tablet qd with meals   unknown at unknown  . methocarbamol (ROBAXIN) 500 MG tablet Take 500 mg by mouth 2 (two) times daily.   unknown at unknown  . SUMAtriptan (IMITREX) 100 MG tablet Take 100 mg by mouth daily as needed.   0 unknown at unknown  . venlafaxine XR (EFFEXOR-XR) 37.5 MG 24 hr capsule Take 37.5 mg by mouth daily with breakfast.   unknown at unknown  . acetaminophen-codeine (TYLENOL #3) 300-30 MG tablet Take 1 tablet by mouth every 6 (six) hours as needed for moderate pain.   Not Taking at Unknown time  . LORazepam (ATIVAN) 0.5 MG tablet Take 0.5 mg by mouth 2 (two) times daily. Take 1 tablet po qam and up to 1 tablet daily   Not Taking at Unknown time  . norgestimate-ethinyl estradiol (ORTHO-CYCLEN,SPRINTEC,PREVIFEM) 0.25-35 MG-MCG tablet Take 1 tablet by mouth daily.   Past Week at Unknown time  . sertraline (ZOLOFT) 50 MG tablet Take 100 mg by mouth daily.    Not Taking at Unknown time    Patient Stressors: Loss of relationship  Patient Strengths: Average or above average intelligence Communication skills General fund of knowledge Motivation for treatment/growth  Treatment Modalities: Medication Management, Group therapy, Case management,  1  to 1 session with clinician, Psychoeducation, Recreational therapy.   Physician Treatment Plan for Primary Diagnosis: MDD (major depressive disorder), recurrent episode, severe ([place]) Long Term Goal(s): Improvement in symptoms so as ready for discharge Improvement in symptoms so as ready for discharge   Short Term Goals: Ability to identify changes in lifestyle to reduce recurrence of condition will improve Ability to verbalize feelings will improve Ability to demonstrate self-control will improve Ability to identify and develop effective coping behaviors will improve Ability to identify triggers associated with substance abuse/mental health issues  will improve Ability to disclose and discuss suicidal ideas Ability to identify and develop effective coping behaviors will improve  Medication Management: Evaluate patient's response, side effects, and tolerance of medication regimen.  Therapeutic Interventions: 1 to 1 sessions, Unit Group sessions and Medication administration.  Evaluation of Outcomes: Not Met  Physician Treatment Plan for Secondary Diagnosis: Principal Problem:   MDD (major depressive disorder), recurrent episode, severe (HCC) Active Problems:   Chronic migraine   PTSD (post-traumatic stress disorder)   Tobacco use disorder  Long Term Goal(s): Improvement in symptoms so as ready for discharge Improvement in symptoms so as ready for discharge   Short Term Goals: Ability to identify changes in lifestyle to reduce recurrence of condition will improve Ability to verbalize feelings will improve Ability to demonstrate self-control will improve Ability to identify and develop effective coping behaviors will improve Ability to identify triggers associated with substance abuse/mental health issues will improve Ability to disclose and discuss suicidal ideas Ability to identify and develop effective coping behaviors will improve     Medication Management: Evaluate patient's response, side effects, and tolerance of medication regimen.  Therapeutic Interventions: 1 to 1 sessions, Unit Group sessions and Medication administration.  Evaluation of Outcomes: Not Met   RN Treatment Plan for Primary Diagnosis: MDD (major depressive disorder), recurrent episode, severe ([place]) Long Term Goal(s): Knowledge of disease and therapeutic regimen to maintain health will improve  Short Term Goals: Ability to verbalize feelings will improve, Ability to identify and develop effective coping behaviors will improve and Compliance with prescribed medications will improve  Medication Management: RN will administer medications as ordered by  provider, will assess and evaluate patient's response and provide education to patient for prescribed medication. RN will report any adverse and/or side effects to prescribing provider.  Therapeutic Interventions: 1 on 1 counseling sessions, Psychoeducation, Medication administration, Evaluate responses to treatment, Monitor vital signs and CBGs as ordered, Perform/monitor CIWA, COWS, AIMS and Fall Risk screenings as ordered, Perform wound care treatments as ordered.  Evaluation of Outcomes: Not Met   LCSW Treatment Plan for Primary Diagnosis: MDD (major depressive disorder), recurrent episode, severe ([place]) Long Term Goal(s): Safe transition to appropriate next level of care at discharge, Engage patient in therapeutic group addressing interpersonal concerns.  Short Term Goals: Engage patient in aftercare planning with referrals and resources and Increase social support  Therapeutic Interventions: Assess for all discharge needs, 1 to 1 time with Social worker, Explore available resources and support systems, Assess for adequacy in community support network, Educate family and significant other(s) on suicide prevention, Complete Psychosocial Assessment, Interpersonal group therapy.  Evaluation of Outcomes: Not Met    Recreational Therapy Treatment Plan for Primary Diagnosis: MDD (major depressive disorder), recurrent episode, severe ([place]) Long Term Goal(s): Patient will participate in recreation therapy treatment in at least 2 group sessions without prompting from LRT  Short Term Goals: Increase self-esteem, Increase stress management skills  Treatment Modalities: Group Therapy and Individual  Treatment Sessions  Therapeutic Interventions: Psychoeducation  Evaluation of Outcomes: Progressing   Progress in Treatment: Attending groups: No. Participating in groups: No. Taking medication as prescribed: Yes. Toleration medication: Yes. Family/Significant other contact made: No, will  contact:  boyfriend Patient understands diagnosis: Yes. Discussing patient identified problems/goals with staff: Yes. Medical problems stabilized or resolved: Yes. Denies suicidal/homicidal ideation: Yes. Issues/concerns per patient self-inventory: No. Other: none  New problem(s) identified: No, Describe:  none  New Short Term/Long Term Goal(s): Pt goal: to be a better mom and girlfriend and to deal with my depression in a different way.  Discharge Plan or Barriers: Pt will return to outpatient services at [facility].  Reason for Continuation of Hospitalization: Depression Medication stabilization  Estimated Length of Stay: 3-5 days.  Attendees: Patient:Laura Pitts 09/21/2016   Physician: Dr. Jerilee Hoh, MD 09/21/2016   Nursing: Elige Radon, RN 09/21/2016   RN Care Manager: 09/21/2016   Social Worker: Lurline Idol, LCSW 09/21/2016   Recreational Therapist: Drue Flirt, LRT/CTRS  09/21/2016   Other:  09/21/2016   Other:  09/21/2016   Other: 09/21/2016     Scribe for Treatment Team: Joanne Chars, [place] 09/21/2016 3:06 PM

## 2016-09-21 NOTE — Plan of Care (Signed)
Problem: Education: Goal: Mental status will improve Outcome: Progressing Pt able to discuss her feelings
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
## 2016-09-21 NOTE — Plan of Care (Signed)
Problem: Education: Goal: Emotional status will improve Outcome: Progressing Has started talking to staff and peers, expressing her needs appropriately

## 2016-09-21 NOTE — [facility] Suicide Risk Assessment (Signed)
[facility]   Nursing information obtained from:    Demographic factors:    Current Mental Status:    Loss Factors:    Historical Factors:    Risk Reduction Factors:     Total Time spent with patient: 1 hour Principal Problem: MDD (major depressive disorder), recurrent episode, severe (HCC) Diagnosis:   Patient Active Problem List   Diagnosis Date Noted  . MDD (major depressive disorder), recurrent episode, severe (HCC) [F33.2] 09/21/2016  . PTSD (post-traumatic stress disorder) [F43.10] 09/21/2016  . Tobacco use disorder [F17.200] 09/21/2016  . Chronic migraine [G43.709] 02/24/2014   Subjective Data:   Continued Clinical Symptoms:  Alcohol Use Disorder Identification Test Final Score (AUDIT): 1 The "Alcohol Use Disorders Identification Test", Guidelines for Use in Primary Care, Second Edition.  World Science writerHealth Organization [facility](WHO). Score between 0-7:  no or low risk or alcohol related problems. Score between 8-15:  moderate risk of alcohol related problems. Score between 16-19:  high risk of alcohol related problems. Score 20 or above:  warrants further diagnostic evaluation for alcohol dependence and treatment.   CLINICAL FACTORS:   Severe Anxiety and/or Agitation Depression:   Insomnia Severe Chronic Pain More than one psychiatric diagnosis Previous Psychiatric Diagnoses and Treatments    Psychiatric Specialty Exam: Physical Exam  ROS  Blood pressure 106/65, pulse 77, temperature 98.2 F (36.8 C), temperature source Oral, resp. rate 18, height 5\' 5"  (1.651 m), weight 69.9 kg (154 lb), last menstrual period 09/19/2016, SpO2 100 %.Body mass index is 25.63 kg/m.                                                    Sleep:  Number of Hours: 6.5      COGNITIVE FEATURES THAT CONTRIBUTE TO RISK:  Closed-mindedness    SUICIDE RISK:   Moderate:  Frequent suicidal ideation with limited intensity, and duration, some  specificity in terms of plans, no associated intent, good self-control, limited dysphoria/symptomatology, some risk factors present, and identifiable protective factors, including available and accessible social support.  [facility]  I certify that inpatient services furnished can reasonably be expected to improve the patient's condition.   Jimmy FootmanHernandez-Gonzalez,  Avary Pitsenbarger, MD 09/21/2016, 11:35 AM

## 2016-09-21 NOTE — [facility] Group Notes (Signed)
[facility] LCSW Group Therapy  09/21/2016 10:36 AM  Type of Therapy:  Group Therapy  Participation Level:  Patient did not attend group. CSW invited patient to group.   Summary of Progress/Problems: Stress management: Patients defined and discussed the topic of stress and the related symptoms and triggers for stress. Patients identified healthy coping skills they would like to try during hospitalization and after discharge to manage stress in a healthy way. CSW offered insight to varying stress management techniques.   Prezley Qadir G. Garnette CzechSampson MSW, LCSWA 09/21/2016, 10:36 AM
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
## 2016-09-21 NOTE — [facility] Group Notes (Signed)
[facility] Group Notes:  (Nursing/MHT/Case Management/Adjunct)  Date:  09/21/2016  Time:  4:33 PM  Type of Therapy:  Psychoeducational Skills  Participation Level:  Active  Participation Quality:  Appropriate and Supportive  Affect:  Appropriate  Cognitive:  Appropriate  Insight:  Appropriate  Engagement in Group:  Engaged and Supportive  Modes of Intervention:  Discussion and Education  Summary of Progress/Problems:  Laura Pitts Laura Pitts 09/21/2016, 4:33 PM
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
## 2016-09-21 NOTE — Plan of Care (Signed)
Problem: Coping: Goal: Ability to verbalize frustrations and anger appropriately will improve Outcome: Progressing Maintains a positive attitude, communicating appropriately

## 2016-09-21 NOTE — Plan of Care (Signed)
Problem: [facility] Participation in Recreation Therapeutic Interventions Goal: STG-Patient will demonstrate improved self esteem by identif STG: Self-Esteem - Within 4 treatment sessions, patient will verbalize at least 5 positive affirmation statements in each of 2 treatment sessions to increase self-esteem.  Outcome: Progressing Treatment Session 1; Completed 1 out of 2: At approximately 3:15 pm, LRT met with patient in consultation room. Patient verbalized 5 positive affirmation statements. Patient reported it felt "good". LRT encouraged patient to continue saying positive affirmation statements.  Leonette Monarch, LRT/CTRS 05.11.18 3:40 pm Goal: STG-Other Recreation Therapy Goal (Specify) STG: Stress Management - Within 4 treatment sessions, patient will verbalize understanding of the stress management techniques in each of 2 treatment sessions.  Outcome: Progressing Treatment Session 1; Completed 1 out of 2: At approximately 3:15 pm, LRT met with patient in consultation room. LRT educated and provided patient with handouts on stress management techniques. Patient verbalized understanding. LRT encouraged patient to read over and practice the stress management techniques.  Leonette Monarch, LRT/CTRS 05.11.18 3:42 pm

## 2016-09-21 NOTE — Progress Notes (Signed)
First am presented with sad flat affect.  As day progressed affect brightened.  Denies SI/HI/AVH.  Support and encouragement offered. Safety maintained.  Scheduled medications given.
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
## 2016-09-22 LAB — HEMOGLOBIN A1C
Hgb A1c MFr Bld: 5.1 % (ref 4.8–5.6)
Mean Plasma Glucose: 100 mg/dL

## 2016-09-22 LAB — PROLACTIN: Prolactin: 19.4 ng/mL (ref 4.8–23.3)

## 2016-09-22 LAB — VITAMIN B12: Vitamin B-12: 175 pg/mL — ABNORMAL LOW (ref 180–914)

## 2016-09-22 MED ORDER — AMITRIPTYLINE HCL 50 MG PO TABS
50.0000 mg | ORAL_TABLET | Freq: Every day | ORAL | Status: DC
  Administered 2016-09-22: 50 mg via ORAL
  Filled 2016-09-22: qty 1

## 2016-09-22 MED ORDER — METHOCARBAMOL 500 MG PO TABS
500.0000 mg | ORAL_TABLET | Freq: Two times a day (BID) | ORAL | Status: DC
  Administered 2016-09-22 – 2016-09-24 (×4): 500 mg via ORAL
  Filled 2016-09-22 (×4): qty 1

## 2016-09-22 MED ORDER — CYANOCOBALAMIN 1000 MCG/ML IJ SOLN
1000.0000 ug | Freq: Every day | INTRAMUSCULAR | Status: DC
  Administered 2016-09-22 – 2016-09-24 (×3): 1000 ug via SUBCUTANEOUS
  Filled 2016-09-22 (×3): qty 1

## 2016-09-22 NOTE — [facility] Group Notes (Signed)
[facility] Group Notes:  (Nursing/MHT/Case Management/Adjunct)  Date:  09/22/2016  Time:  11:31 PM  Type of Therapy:  Group Therapy  Participation Level:  Active  Participation Quality:  Appropriate  Affect:  Appropriate  Cognitive:  Alert  Insight:  Good  Engagement in Group:  Engaged  Modes of Intervention:  Support  Summary of Progress/Problems:  Mayra NeerJackie L Merita Hawks 09/22/2016, 11:31 PM
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
## 2016-09-22 NOTE — Progress Notes (Signed)
Denies SI/HI/AVH. Visible in milieu with appropriate behaviors among staff and peers. Attended evening group. Forwards little at this time. Denies pain. Encouragement and support provided. Safety maintained. Will continue to monitor.

## 2016-09-22 NOTE — [facility] Group Notes (Signed)
[facility] Group Notes:  (Nursing/MHT/Case Management/Adjunct)  Date:  09/22/2016  Time:  2:20 AM  Type of Therapy:  Psychoeducational Skills  Participation Level:  Active  Participation Quality:  Appropriate, Sharing and Supportive  Affect:  Appropriate  Cognitive:  Alert, Appropriate and Oriented  Insight:  Good  Engagement in Group:  Engaged  Modes of Intervention:  Discussion  Summary of Progress/Problems:  Laura Pitts R [name] 09/22/2016, 2:20 AM

## 2016-09-22 NOTE — Progress Notes (Signed)
Denies SI/HI/AVH. Visible in milieu with appropriate behaviors among staff and peers. Attended evening group.  Reports improved mood. Affect brightens during interaction but remains guarded at this time. Denies pain. Encouragement and support provided. Safety maintained. Will continue to monitor.

## 2016-09-22 NOTE — Progress Notes (Signed)
D: Pt observed crying on phone this AM on initial contact. Endorsed SI without plan "because my mom called my ex-husband to pick up my daughter and I have papers waiting on me at home". Verbally contracts for safety. Denies HI and AVH when assessed. Pt rates her depression, hopelessness and anxiety all 10/10 on self inventory sheet. Per pt "I'm ok with y'all telling my mom about my day but I don't want to talk to her and I don't want her to come visit me". A: Scheduled medications administered as ordered and effects monitored. Emotional support and availability provided to pt. Encouraged pt to voice concerns and comply with current treatment regimen including unit groups. Safety checks maintained at Q 15 minutes intervals without issues to note at thus far.  R: Pt receptive to care. Compliant with medications when offered. Denies adverse drug reactions. Tolerated all PO intake well. Attended groups on unit. Remains safe on and off unit. Denies concerns at this time.

## 2016-09-22 NOTE — [facility] Group Notes (Signed)
[facility] LCSW Group Therapy Note  Date/Time: 09/22/16, 1300  Type of Therapy/Topic:  Group Therapy:  Balance in Life  Participation Level:  active  Description of Group:    This group will address the concept of balance and how it feels and looks when one is unbalanced. Patients will be encouraged to process areas in their lives that are out of balance, and identify reasons for remaining unbalanced. Facilitators will guide patients utilizing problem- solving interventions to address and correct the stressor making their life unbalanced. Understanding and applying boundaries will be explored and addressed for obtaining  and maintaining a balanced life. Patients will be encouraged to explore ways to assertively make their unbalanced needs known to significant others in their lives, using other group members and facilitator for support and feedback.  Therapeutic Goals: 1. Patient will identify two or more emotions or situations they have that consume much of in their lives. 2. Patient will identify signs/triggers that life has become out of balance:  3. Patient will identify two ways to set boundaries in order to achieve balance in their lives:  4. Patient will demonstrate ability to communicate their needs through discussion and/or role plays  Summary of Patient Progress: Pt shared that relationships and mental/emotional are areas that are out of balance in her life.  Pt shared regarding her current situation with a custody battle looming with her ex husband.  Pt somewhat immature in group but was active and attentive.          Therapeutic Modalities:   Cognitive Behavioral Therapy Solution-Focused Therapy Assertiveness Training  Daleen SquibbGreg Aleyna Cueva, [place]CS[place]

## 2016-09-23 LAB — THYROID PANEL WITH TSH
Free Thyroxine Index: 1.7 (ref 1.2–4.9)
T3 Uptake Ratio: 19 % — ABNORMAL LOW (ref 24–39)
T4, Total: 9 ug/dL (ref 4.5–12.0)
TSH: 0.505 u[IU]/mL (ref 0.450–4.500)

## 2016-09-23 MED ORDER — AMITRIPTYLINE HCL 50 MG PO TABS
75.0000 mg | ORAL_TABLET | Freq: Every day | ORAL | Status: DC
  Administered 2016-09-23: 75 mg via ORAL
  Filled 2016-09-23: qty 1

## 2016-09-23 NOTE — [facility] Group Notes (Signed)
[facility] LCSW Group Therapy   09/23/2016 2 PM   Type of Therapy: Group Therapy   Participation Level: Active   Participation Quality: Attentive, Sharing and Supportive   Affect: Appropriate   Cognitive: Alert and Oriented   Insight: Developing/Improving and Engaged   Engagement in Therapy: Developing/Improving and Engaged   Modes of Intervention: Clarification, Confrontation, Discussion, Education, Exploration, Limit-setting, Orientation, Problem-solving, Rapport Building, Dance movement psychotherapisteality Testing, Socialization and Support   Summary of Progress/Problems: The topic for group today was support or lack of support and how this affects recovery. This group focused on both positive and negative aspects of support or lack therof and allowed  group members to process ways to cope with negative emotions by discussing their coping mechanisms for a lack of support. Group members were asked to reflect on a time when their reaction to a lack of support led to a negative outcome and explored how using coping mechanisms to regulate their emotions had benefited them. Group members were also asked to discuss a time when emotion regulation was utilized when they felt overwhelmed.      Hampton AbbotKadijah Marylee Belzer, MSW, LCSW-A 09/23/2016, 3:28PM

## 2016-09-23 NOTE — Progress Notes (Signed)
Denies SI/HI/AVH.   Affect bright.  Visible in the milieu.  Interacting appropriately. Medication and group compliant.  Support and encouragement offered.  Safety maintained.

## 2016-09-23 NOTE — Progress Notes (Signed)
Denies SI/HI/AVH. Visible in milieu with appropriate behaviors among staff and peers. Attended evening group.  Reports improved mood. Affect brightens during interaction. Encouragement and support provided. Safety maintained. Will continue to monitor.
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
## 2016-09-24 DIAGNOSIS — E538 Deficiency of other specified B group vitamins: Secondary | ICD-10-CM

## 2016-09-24 MED ORDER — VENLAFAXINE HCL ER 150 MG PO CP24: 150.0000 mg | ORAL_CAPSULE | Freq: Every day | ORAL | 0 refills | Status: DC

## 2016-09-24 MED ORDER — AMITRIPTYLINE HCL 75 MG PO TABS: 75.0000 mg | ORAL_TABLET | Freq: Every day | ORAL | 0 refills | Status: DC

## 2016-09-24 NOTE — Progress Notes (Signed)
Recreation Therapy Notes  INPATIENT RECREATION TR PLAN  Patient Details Name: OCTAVIA VELADOR MRN: [id number] DOB: 10-30-1995 Today's Date: 09/24/2016  Rec Therapy Plan Is patient appropriate for Therapeutic Recreation?: Yes Treatment times per week: At least once a week TR Treatment/Interventions: 1:1 session, Group participation (Comment) (Appropriate particiaption in daily recreational therapy tx)  Discharge Criteria Pt will be discharged from therapy if:: Treatment goals are met, Discharged Treatment plan/goals/alternatives discussed and agreed upon by:: Patient/family  Discharge Summary Short term goals set: See Care Plan Short term goals met: Adequate for discharge Progress toward goals comments: One-to-one attended Which groups?: Social skills One-to-one attended: Self-esteem, stress management Reason goals not met: Patient d/c before goals could be met Therapeutic equipment acquired: None Reason patient discharged from therapy: Discharge from hospital Pt/family agrees with progress & goals achieved: Yes Date patient discharged from therapy: 09/24/16   Leonette Monarch, LRT/CTRS 09/24/2016, 5:42 PM

## 2016-09-24 NOTE — Progress Notes (Signed)
  [facility] :  Will you be returning to the same living situation after discharge:  Yes,  with boyfriend At discharge, do you have transportation home?: Yes,  boyfriend Do you have the ability to pay for your medications: Yes,  medicaid  Release of information consent forms completed and in the chart;  Patient's signature needed at discharge.  Patient to Follow up at: Follow-up Information    [facility] Go on 10/[facility]/2016.   Why:  Please attend your follow up therapy appointment with Beverlee NimsGregory Allen on 10/02/16 at 1:00pm.  Please attend your follow up medication management appointment on Wednesday, 10/10/16 at 2:00pm.  Please bring a copy of your hospital discharge paperwork. Contact information: [address] [place] [address] [phone]        [facility].   Why:  Please attend your hospital discharge appointment on Wednesday, 09/26/16, at 9:00am.  Please bring a copy of your hospital discharge paperwork. Contact information: [address] [place] [address] [phone]           Next level of care provider [facility] Link:no  Safety Planning and Suicide Prevention discussed: Yes,  with boyfriend  Have you used any form of tobacco in the last 30 days? (Cigarettes, Smokeless Tobacco, Cigars, and/or Pipes): Yes  Has patient been referred to the Quitline?: Patient refused referral  Patient has been referred for addiction treatment: Yes  Lorri FrederickWierda, Saralee Bolick Jon, LCSW 09/24/2016, 3:33 PM

## 2016-09-24 NOTE — Plan of Care (Signed)
Problem: Self-Concept: Goal: Ability to verbalize positive feelings about self will improve Outcome: Progressing Pt reported that she was proud of herself for a goal she has achieved.

## 2016-09-24 NOTE — [facility] Group Notes (Signed)
[facility] Group Notes:  (Nursing/MHT/Case Management/Adjunct)  Date:  09/24/2016  Time:  3:14 AM  Type of Therapy:  Group Therapy  Participation Level:  Active  Participation Quality:  Appropriate  Affect:  Appropriate  Cognitive:  Appropriate  Insight:  Appropriate  Engagement in Group:  Engaged  Modes of Intervention:  Discussion  Summary of Progress/Problems:  Burt EkJanice Marie Marsean Elkhatib 09/24/2016, 3:14 AM

## 2016-09-24 NOTE — [facility] Suicide Risk Assessment (Signed)
[facility]   Principal Problem: MDD (major depressive disorder), recurrent episode, severe (HCC) Discharge Diagnoses:  Patient Active Problem List   Diagnosis Date Noted  . B12 deficiency [E53.8] 09/24/2016  . MDD (major depressive disorder), recurrent episode, severe (HCC) [F33.2] 09/21/2016  . PTSD (post-traumatic stress disorder) [F43.10] 09/21/2016  . Tobacco use disorder [F17.200] 09/21/2016  . Chronic migraine [G43.709] 02/24/2014    Psychiatric Specialty Exam: ROS  Blood pressure 108/65, pulse 70, temperature 98 F (36.7 C), temperature source Oral, resp. rate 18, height 5\' 5"  (1.651 m), weight 69.9 kg (154 lb), last menstrual period 09/19/2016, SpO2 100 %.Body mass index is 25.63 kg/m.                                                       Mental Status Per Nursing Assessment::   On Admission:     Demographic Factors:  Adolescent or young adult and Caucasian  Loss Factors: Loss of significant relationship and Financial problems/change in socioeconomic status  Historical Factors: Family history of mental illness or substance abuse and Impulsivity  Risk Reduction Factors:   Responsible for children under 22 years of age and Positive social support  No access to guns  Continued Clinical Symptoms:  More than one psychiatric diagnosis Previous Psychiatric Diagnoses and Treatments  Cognitive Features That Contribute To Risk:  Polarized thinking    Suicide Risk:  Minimal: No identifiable suicidal ideation.  Patients presenting with no risk factors but with morbid ruminations; may be classified as minimal risk based on the severity of the depressive symptoms  Follow-up Information    [facility] Go on [facility].   Why:  Please attend your follow up therapy appointment with Beverlee NimsGregory Allen on 10/02/16 at 1:00pm.  Please attend your follow up medication management appointment on Wednesday, 10/10/16 at 2:00pm.   Please bring a copy of your hospital discharge paperwork. Contact information: [address] [place] [address] [phone]        [facility] Go on [facility]/2016.   Why:  Please attend your hospital discharge appointment on Wednesday, 09/26/16, at 9:00am.  Please bring a copy of your hospital discharge paperwork. Contact information: [address] [place] [address] [phone]           Jimmy FootmanHernandez-Gonzalez,  Issaiah Seabrooks, MD 09/24/2016, 12:28 PM
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
## 2016-09-24 NOTE — Progress Notes (Signed)
Bright affect.  Denies Si/HI/AVH. Discharge instructions given, verbalized understanding.   Prescriptions given and personal belongings returned.  Escorted off unit by this writer to main entrance to travel home with her fiance.

## 2016-11-25 ENCOUNTER — Other Ambulatory Visit: Payer: Self-pay | Admitting: Obstetrics & Gynecology

## 2016-12-19 ENCOUNTER — Ambulatory Visit: Payer: Self-pay | Admitting: Obstetrics and Gynecology

## 2016-12-26 ENCOUNTER — Ambulatory Visit: Payer: Self-pay | Admitting: Obstetrics and Gynecology

## 2016-12-26 ENCOUNTER — Other Ambulatory Visit: Payer: Self-pay

## 2016-12-26 MED ORDER — NORGESTIMATE-ETH ESTRADIOL 0.25-35 MG-MCG PO TABS: 1.0000 | ORAL_TABLET | Freq: Every day | ORAL | 0 refills | Status: DC

## 2017-01-17 ENCOUNTER — Encounter: Payer: Self-pay | Admitting: Obstetrics and Gynecology

## 2017-01-17 ENCOUNTER — Ambulatory Visit (INDEPENDENT_AMBULATORY_CARE_PROVIDER_SITE_OTHER): Payer: Medicaid Other | Admitting: Obstetrics and Gynecology

## 2017-01-17 VITALS — BP 118/74 | Ht 65.0 in | Wt 165.0 lb

## 2017-01-17 DIAGNOSIS — Z30017 Encounter for initial prescription of implantable subdermal contraceptive: Secondary | ICD-10-CM | POA: Diagnosis not present

## 2017-01-17 DIAGNOSIS — Z1389 Encounter for screening for other disorder: Secondary | ICD-10-CM | POA: Diagnosis not present

## 2017-01-17 DIAGNOSIS — Z0000 Encounter for general adult medical examination without abnormal findings: Secondary | ICD-10-CM | POA: Diagnosis not present

## 2017-01-17 DIAGNOSIS — Z01419 Encounter for gynecological examination (general) (routine) without abnormal findings: Secondary | ICD-10-CM

## 2017-01-17 DIAGNOSIS — Z1339 Encounter for screening examination for other mental health and behavioral disorders: Secondary | ICD-10-CM

## 2017-01-17 DIAGNOSIS — Z113 Encounter for screening for infections with a predominantly sexual mode of transmission: Secondary | ICD-10-CM | POA: Diagnosis not present

## 2017-01-17 MED ORDER — ETONOGESTREL 68 MG ~~LOC~~ IMPL: 68.0000 mg | DRUG_IMPLANT | Freq: Once | SUBCUTANEOUS | Status: DC

## 2017-01-17 NOTE — Progress Notes (Signed)
Gynecology Annual Exam   [place]: [place], [facility]  Chief Complaint  Patient presents with  . Annual Exam    History of Present Illness:  Ms. Laura Pitts is a 21 y.o. G1P1001 who LMP was Patient's last menstrual period was 12/12/2016., presents today for her annual examination.  Her menses are Irregular due to the fact that she does not take her contraception regularly.  She is not sexually active.  Last Pap: 1 year  Results were: no abnormalities /neg HPV DNA not done Hx of STDs: none  There is no FH of breast cancer. There is no FH of ovarian cancer. The patient does not do self-breast exams.  Tobacco use: The patient currently smokes 0.5 packs of cigarettes per day for the past few years years. Alcohol use: social drinker Exercise: moderately active  The patient wears seatbelts: yes.   The patient reports that domestic violence in her life is absent.   She [place] in [place].  No acute concerns today.   She would also like to discuss a change in her contraception.  She states that she has difficulty with remembering to take her pill. She has tried an IUD in the past and had pain associated with it. She would like a longer term form of contraception.  Past Medical History:  Diagnosis Date  . Anxiety   . Bipolar affective disorder (HCC)   . Depression   . Migraine    Past Surgical History: denies   Prior to Admission medications   Medication Sig Start Date End Date Taking? Authorizing Provider  amitriptyline (ELAVIL) 75 MG tablet Take 1 tablet (75 mg total) by mouth at bedtime. 09/24/16  Yes Jimmy FootmanHernandez-Gonzalez, Andrea, MD  lamoTRIgine (LAMICTAL) 25 MG tablet Take 50 mg by mouth 2 (two) times daily.   Yes [provider]  meloxicam (MOBIC) 15 MG tablet Take 15 mg by mouth daily. Take 1 tablet qd with meals   Yes [provider]  venlafaxine XR (EFFEXOR-XR) 150 MG 24 hr capsule Take 1 capsule (150 mg total) by mouth  daily with breakfast. 09/25/16  Yes Jimmy FootmanHernandez-Gonzalez, Andrea, MD  methocarbamol (ROBAXIN) 500 MG tablet Take 500 mg by mouth 2 (two) times daily.    [provider]  norgestimate-ethinyl estradiol (ORTHO-CYCLEN,SPRINTEC,PREVIFEM) 0.25-35 MG-MCG tablet Take 1 tablet by mouth daily. Patient not taking: Reported on 01/17/2017 12/26/16   Conard NovakJackson, Burnice Vassel D, MD    Allergies  Allergen Reactions  . Latex     Gynecologic History: Patient's last menstrual period was 12/12/2016. History of abnormal pap smear: No History of STI: No   Obstetric History: G1P1001  Social History   Social History  . Marital status: Single    Spouse name: N/A  . Number of children: N/A  . Years of education: N/A   Occupational History  . Not on file.   Social History Main Topics  . Smoking status: Current Every Day Smoker    Packs/day: 0.50    Types: Cigarettes  . Smokeless tobacco: Never Used  . Alcohol use Yes  . Drug use: No  . Sexual activity: Yes    Birth control/ protection: Pill   Other Topics Concern  . Not on file   Social History Narrative  . No narrative on file    Family History  Problem Relation Age of Onset  . Migraines Mother   . Hypertension Father   . Brain cancer Maternal Grandmother   . Lung cancer Maternal  Grandfather    Review of Systems  Constitutional: Negative.   HENT: Negative.   Eyes: Negative.   Respiratory: Negative.   Cardiovascular: Negative.   Gastrointestinal: Negative.   Genitourinary: Negative.   Musculoskeletal: Negative.   Skin: Negative.   Neurological: Negative.   Psychiatric/Behavioral: Positive for depression. Negative for hallucinations, memory loss, substance abuse and suicidal ideas. The patient is nervous/anxious. The patient does not have insomnia.      Physical Exam BP 118/74   Ht  (1.651 m)   Wt 165 lb (74.8 kg)   LMP 12/12/2016   BMI 27.46 kg/m    Physical Exam  Constitutional: She is oriented to person, place, and  time. She appears well-developed and well-nourished. No distress.  Genitourinary: Vagina normal and uterus normal. Pelvic exam was performed with patient supine. There is no rash, tenderness or lesion on the right labia. There is no rash, tenderness or lesion on the left labia. Right adnexum does not display mass, does not display tenderness and does not display fullness. Left adnexum does not display mass, does not display tenderness and does not display fullness. Cervix does not exhibit lesion, discharge or polyp.   Uterus is mobile and anteverted. Uterus is not enlarged, tender, exhibiting a mass or irregular (is regular).  HENT:  Head: Normocephalic and atraumatic.  Eyes: EOM are normal. No scleral icterus.  Neck: Normal range of motion. Neck supple. No thyromegaly present.  Cardiovascular: Normal rate and regular rhythm.   Pulmonary/Chest: Effort normal and breath sounds normal. No respiratory distress. She has no wheezes. She has no rales.  Abdominal: Soft. Bowel sounds are normal. She exhibits no distension and no mass. There is no tenderness. There is no rebound and no guarding.  Musculoskeletal: Normal range of motion. She exhibits no edema.  Lymphadenopathy:    She has no cervical adenopathy.  Neurological: She is alert and oriented to person, place, and time. No cranial nerve deficit.  Skin: Skin is warm and dry. No erythema.  Psychiatric: She has a normal mood and affect. Her behavior is normal. Judgment normal.   GYNECOLOGY PROCEDURE NOTE  Patient is a 21 y.o. G1P1001 presenting for Nexplanon insertion as her desires means of contraception.  She provided informed consent, signed copy in the chart, time out was performed. Pregnancy test was negative, with self reported LMP of Patient's last menstrual period was 12/12/2016.  She understands that Nexplanon is a progesterone only therapy, and that patients often patients have irregular and unpredictable vaginal bleeding or amenorrhea.  She understands that other side effects are possible related to systemic progesterone, including but not limited to, headaches, breast tenderness, nausea, and irritability. While effective at preventing pregnancy long acting reversible contraceptives do not prevent transmission of sexually transmitted diseases and use of barrier methods for this purpose was discussed. The placement procedure for Nexplanon was reviewed with the patient in detail including risks of nerve injury, infection, bleeding and injury to other muscles or tendons. She understands that the Nexplanon implant is good for 3 years and needs to be removed at the end of that time.  She understands that Nexplanon is an extremely effective option for contraception, with failure rate of <1%. This information is reviewed today and all questions were answered. Informed consent was obtained, both verbally and written.   The patient is healthy and has no contraindications to Implanon use. Urine pregnancy test was performed today and was negative.  Procedure Appropriate time out taken.  Patient placed in dorsal supine  with left arm above head, elbow flexed at 90 degrees, arm resting on examination table.  The bicipital grove was palpated and site 8-10cm proximal to the medial epicondyle was indentified . The insertion site was prepped with a two betadine swabs and then injected with 3 cc of 2% lidocaine without epinephrine.  Nexplanon removed form sterile blister packaging,  Device confirmed in needle, before inserting full length of needle, tenting up the skin as the needle was advance.  The drug eluting rod was then deployed by pulling back the slider per the manufactures recommendation.  The implant was palpable by the clinician as well as the patient.  The insertion site covered dressed with a band aid before applying  a kerlex bandage pressure dressing..Minimal blood loss was noted during the procedure.  The patientt tolerated the procedure well.    She was instructed to wear the bandage for 24 hours, call with any signs of infection.  She was given the Implanon card and instructed to have the rod removed in 3 years.  Female chaperone present for pelvic and breast  portions of the physical exam  Results: AUDIT Questionnaire (screen for alcoholism): 5  Assessment: 21 y.o. G7P1001 female here for routine annual gynecologic examination  Plan: Problem List Items Addressed This Visit    None    Visit Diagnoses    Women's annual routine gynecological examination    -  Primary   Relevant Orders   GC/Chlamydia Probe Amp   Screen for STD (sexually transmitted disease)       Relevant Orders   GC/Chlamydia Probe Amp   Screening for alcohol problem       Encounter for initial prescription of implantable subdermal contraceptive          Screening: -- Blood pressure screen normal -- Weight screening: normal -- Depression screening negative (PHQ-9) -- Nutrition: normal -- cholesterol screening: not due for screening -- osteoporosis screening: not due -- tobacco screening: using: discussed quitting using the 5 A's -- alcohol screening: AUDIT questionnaire indicates low-risk usage. -- family history of breast cancer screening: done. not at high risk. -- no evidence of domestic violence or intimate partner violence. -- STD screening: gonorrhea/chlamydia NAAT collected -- pap smear not collected per ASCCP guidelines  Thomasene Mohair, MD 01/17/2017 1:36 PM

## 2017-01-18 LAB — GC/CHLAMYDIA PROBE AMP
Chlamydia trachomatis, NAA: NEGATIVE
Neisseria gonorrhoeae by PCR: NEGATIVE

## 2017-01-30 ENCOUNTER — Encounter: Payer: Self-pay | Admitting: Obstetrics and Gynecology

## 2017-02-21 ENCOUNTER — Encounter: Payer: Self-pay | Admitting: Obstetrics and Gynecology

## 2017-02-27 ENCOUNTER — Ambulatory Visit (INDEPENDENT_AMBULATORY_CARE_PROVIDER_SITE_OTHER): Payer: Medicaid Other | Admitting: Obstetrics and Gynecology

## 2017-02-27 ENCOUNTER — Encounter: Payer: Self-pay | Admitting: Obstetrics and Gynecology

## 2017-02-27 VITALS — BP 114/78 | Ht 65.0 in | Wt 160.0 lb

## 2017-02-27 DIAGNOSIS — R232 Flushing: Secondary | ICD-10-CM

## 2017-02-27 DIAGNOSIS — N644 Mastodynia: Secondary | ICD-10-CM

## 2017-02-27 NOTE — Progress Notes (Signed)
Obstetrics & Gynecology Office Visit   Chief Complaint  Patient presents with  . Breast Pain   History of Present Illness: 21 y.o. G1P1001 female who presents for two new issues. The first issue is that she is having hot flashes. These began about 7-8 weeks ago and have progressively worsened since that time. She especially associates this with the placement of her Nexplanon.  She has only started one new medication since that time, which was very recently, well after her symptoms began.  She states the hot flashes came 1-2 times per week initially.  Now, the symptoms come more frequently, not quite daily. The severity is severe. When they conclude she feels cold.  Nothing makes them better or worse or seems to bring them on or alleviate them.  She has no associated symptoms.   The second issue is breast pain on her right upper outer and lower inner breast.  The pain is sharp, lasts for a few minutes. It does not radiate.  She has had no trauma to the area, no recent cough, no new exercise routine or strenuous exercise.  The pain lasts for a few minutes at a time.  The pain is described as moderate.   Nothing makes it better or worse. No associated symptoms.  She denies overlying skin changes, leakage from her nipples.   Past Medical History:  Diagnosis Date  . Anxiety   . Bipolar affective disorder (HCC)   . Depression   . Migraine    Past Surgical History: denies  Gynecologic History: Patient's last menstrual period was 02/01/2017.  Obstetric History: G1P1001, s/p SVD x 1  Family History  Problem Relation Age of Onset  . Migraines Mother   . Hypertension Father   . Brain cancer Maternal Grandmother   . Lung cancer Maternal Grandfather     Social History   Social History  . Marital status: Single    Spouse name: N/A  . Number of children: N/A  . Years of education: N/A   Occupational History  . Not on file.   Social History Main Topics  . Smoking status: Current Every  Day Smoker    Packs/day: 0.50    Types: Cigarettes  . Smokeless tobacco: Never Used  . Alcohol use Yes  . Drug use: No  . Sexual activity: Yes    Birth control/ protection: Pill   Other Topics Concern  . Not on file   Social History Narrative  . No narrative on file    Allergies  Allergen Reactions  . Latex     Prior to Admission medications   Medication Sig Start Date End Date Taking? Authorizing Provider  amitriptyline (ELAVIL) 75 MG tablet Take 1 tablet (75 mg total) by mouth at bedtime. 09/24/16  Yes Jimmy Footman, MD  Dextromethorphan-Quinidine (NUEDEXTA) 20-10 MG CAPS Take by mouth.   Yes [provider]  lamoTRIgine (LAMICTAL) 25 MG tablet Take 50 mg by mouth 2 (two) times daily.   Yes [provider]  venlafaxine XR (EFFEXOR-XR) 150 MG 24 hr capsule Take 1 capsule (150 mg total) by mouth daily with breakfast. 09/25/16  Yes Jimmy Footman, MD  meloxicam (MOBIC) 15 MG tablet Take 15 mg by mouth daily. Take 1 tablet qd with meals    [provider]    Review of Systems  Constitutional:       See HPI  HENT: Negative.   Eyes: Negative.   Respiratory: Negative.   Cardiovascular: Negative.   Gastrointestinal: Negative.  Genitourinary: Negative.   Musculoskeletal: Negative.   Skin: Negative.   Neurological: Negative.   Psychiatric/Behavioral: Negative.      Physical Exam BP 114/78   Ht 5\' 5"  (1.651 m)   Wt 160 lb (72.6 kg)   LMP 02/01/2017   BMI 26.63 kg/m  Patient's last menstrual period was 02/01/2017. Physical Exam  Constitutional: She is oriented to person, place, and time. She appears well-developed and well-nourished. No distress.  HENT:  Head: Normocephalic and atraumatic.  Eyes: Conjunctivae are normal. No scleral icterus.  Neck: Normal range of motion. Neck supple. No thyromegaly present.  Cardiovascular: Normal rate, regular rhythm and normal heart sounds.   Pulmonary/Chest: Effort normal and  breath sounds normal. Right breast exhibits tenderness (along length of intercostal space just below right nipple). Right breast exhibits no inverted nipple, no mass, no nipple discharge and no skin change. Left breast exhibits no inverted nipple, no mass, no nipple discharge, no skin change and no tenderness.  Abdominal: Soft. She exhibits no distension. There is no tenderness.  Musculoskeletal: Normal range of motion. She exhibits no edema.  Lymphadenopathy:    She has no cervical adenopathy.  Neurological: She is alert and oriented to person, place, and time. No cranial nerve deficit.  Skin: Skin is warm and dry. No rash noted.  Psychiatric: She has a normal mood and affect. Her behavior is normal. Judgment normal.    Female chaperone present for pelvic and breast  portions of the physical exam  Assessment: 21 y.o. 141P1001 female here for  1. Mastalgia   2. Hot flashes      Plan: Problem List Items Addressed This Visit    None    Visit Diagnoses    Mastalgia    -  Primary   Hot flashes        Mastalgia: Pain is most reproducible along interspace between ribs.  No breast tissue abnormalities.  She has no history of trauma that she is aware of or recent cough.  Will have her monitor symptoms.  If symptoms persist, will order imaging to ensure no breast pathology.  Hot flashes: etiology not clear.  Apart from Nexplanon, there really aren't any new medications that she has started or stopped in this time frame. She is not having any other symptoms of premature ovarian failure.  Discussed various potential etiologies.  If her symptoms, continue, could consider removing the Nexplanon.  If symptoms still do not resolve, will have to initiate a workup along other lines, as the differential for this could be broad.  However, at this time she has no other symptoms to suggest an etiology.   Thomasene MohairStephen Giann Obara, MD 02/27/2017 12:43 PM

## 2017-06-26 ENCOUNTER — Emergency Department
Admission: EM | Admit: 2017-06-26 | Discharge: 2017-06-26 | Disposition: A | Discharge status: Home / Self Care | Payer: Medicaid Other | Attending: Emergency Medicine | Admitting: Emergency Medicine

## 2017-06-26 ENCOUNTER — Encounter: Payer: Self-pay | Admitting: Emergency Medicine

## 2017-06-26 ENCOUNTER — Emergency Department: Payer: Medicaid Other

## 2017-06-26 DIAGNOSIS — F17210 Nicotine dependence, cigarettes, uncomplicated: Secondary | ICD-10-CM | POA: Diagnosis not present

## 2017-06-26 DIAGNOSIS — Z91040 Latex allergy status: Secondary | ICD-10-CM | POA: Insufficient documentation

## 2017-06-26 DIAGNOSIS — G43911 Migraine, unspecified, intractable, with status migrainosus: Secondary | ICD-10-CM | POA: Diagnosis not present

## 2017-06-26 DIAGNOSIS — Z79899 Other long term (current) drug therapy: Secondary | ICD-10-CM | POA: Insufficient documentation

## 2017-06-26 DIAGNOSIS — R51 Headache: Secondary | ICD-10-CM | POA: Diagnosis present

## 2017-06-26 MED ORDER — SODIUM CHLORIDE 0.9 % IV BOLUS (SEPSIS)
1000.0000 mL | INTRAVENOUS | Status: AC
  Administered 2017-06-26: 1000 mL via INTRAVENOUS

## 2017-06-26 MED ORDER — HALOPERIDOL LACTATE 5 MG/ML IJ SOLN
5.0000 mg | Freq: Once | INTRAMUSCULAR | Status: AC
  Administered 2017-06-26: 5 mg via INTRAVENOUS
  Filled 2017-06-26: qty 1

## 2017-06-26 MED ORDER — DEXAMETHASONE SODIUM PHOSPHATE 10 MG/ML IJ SOLN
10.0000 mg | Freq: Once | INTRAMUSCULAR | Status: AC
  Administered 2017-06-26: 10 mg via INTRAVENOUS
  Filled 2017-06-26: qty 1

## 2017-06-26 MED ORDER — MAGNESIUM SULFATE 2 GM/50ML IV SOLN
2.0000 g | Freq: Once | INTRAVENOUS | Status: AC
  Administered 2017-06-26: 2 g via INTRAVENOUS
  Filled 2017-06-26: qty 50

## 2017-06-26 MED ORDER — DIPHENHYDRAMINE HCL 50 MG/ML IJ SOLN
25.0000 mg | INTRAMUSCULAR | Status: AC
  Administered 2017-06-26: 25 mg via INTRAVENOUS
  Filled 2017-06-26: qty 1

## 2017-06-26 NOTE — ED Notes (Signed)
FN: pt amb to stat desk with c/o having a headache for two mths. Ambulating without difficulty, NAD.

## 2017-06-26 NOTE — ED Notes (Signed)
Second PIV started because haldol and magnesium sulfate are not compatible per [facility]. Pt offered to do one infusion at a time, each lasting and hour or to have second IV placed. Pt agrees to have second PIV placed so both can infuse at same time.

## 2017-06-26 NOTE — ED Notes (Addendum)
Pt calling to plan a ride home prior to medication administration for safety if discharged.

## 2017-06-26 NOTE — ED Notes (Signed)
Patient transported to CT 

## 2017-06-26 NOTE — ED Provider Notes (Signed)
[facility] Emergency Department Provider Note  ____________________________________________   First MD Initiated Contact with Patient 06/26/17 1355     (approximate)  I have reviewed the triage vital signs and the nursing notes.   HISTORY  Chief Complaint Headache    HPI Laura Pitts is a 22 y.o. female with medical history as listed below who presents for evaluation of persistent headache for about 3 months.  She states that it waxes and wanes but was more severe than usual this morning.  She has been seen and other emergency departments and by her primary care provider and was referred to a neurologist, but she does not have an appointment with a neurologist for about 2 months.  She takes amitriptyline at home which sometimes helps but today the pain has been more persistent than usual.  She describes it as a frontal pounding and throbbing headache that radiates down the back of her head and into her neck.  She believes that stress plays a role; her grandmother is in hospice and not expected to live long and she thinks that the added stress and lack of sleep is contributing.  She has nausea and vomited once this morning.  Bright lights and loud noises make the pain worse than usual.  She denies fever/chills, neck stiffness, chest pain, shortness of breath, abdominal pain, and dysuria.  Nothing makes her symptoms better and they are made worse as described above.   Past Medical History:  Diagnosis Date  . Anxiety   . Bipolar affective disorder (HCC)   . Depression   . Migraine     Patient Active Problem List   Diagnosis Date Noted  . B12 deficiency 09/24/2016  . MDD (major depressive disorder), recurrent episode, severe (HCC) 09/21/2016  . PTSD (post-traumatic stress disorder) 09/21/2016  . Tobacco use disorder 09/21/2016  . Chronic migraine 02/24/2014    History reviewed. No pertinent surgical history.  Prior to Admission medications     Medication Sig Start Date End Date Taking? Authorizing Provider  amitriptyline (ELAVIL) 75 MG tablet Take 1 tablet (75 mg total) by mouth at bedtime. 09/24/16   Jimmy Footman, MD  Dextromethorphan-Quinidine (NUEDEXTA) 20-10 MG CAPS Take by mouth.    [provider]  lamoTRIgine (LAMICTAL) 25 MG tablet Take 50 mg by mouth 2 (two) times daily.    [provider]  meloxicam (MOBIC) 15 MG tablet Take 15 mg by mouth daily. Take 1 tablet qd with meals    [provider]  methocarbamol (ROBAXIN) 500 MG tablet Take 500 mg by mouth 2 (two) times daily.    [provider]  venlafaxine XR (EFFEXOR-XR) 150 MG 24 hr capsule Take 1 capsule (150 mg total) by mouth daily with breakfast. 09/25/16   Jimmy Footman, MD    Allergies Latex  Family History  Problem Relation Age of Onset  . Migraines Mother   . Hypertension Father   . Brain cancer Maternal Grandmother   . Lung cancer Maternal Grandfather     Social History Social History   Tobacco Use  . Smoking status: Current Every Day Smoker    Packs/day: 0.50    Types: Cigarettes  . Smokeless tobacco: Never Used  Substance Use Topics  . Alcohol use: Yes  . Drug use: Yes    Types: Marijuana    Review of Systems Constitutional: No fever/chills Eyes: Photophobia but no visual disturbances ENT: No sore throat. Cardiovascular: Denies chest pain. Respiratory: Denies shortness of breath. Gastrointestinal: No  abdominal pain.  Nausea and occasional vomiting.  No diarrhea.  No constipation. Genitourinary: Negative for dysuria. Musculoskeletal: Negative for neck pain.  Negative for back pain. Integumentary: Negative for rash. Neurological: Throbbing frontal headache for about 3 months, waxing and waning, but worse today   ____________________________________________   PHYSICAL EXAM:  VITAL SIGNS: ED Triage Vitals  Enc Vitals Group     BP 06/26/17 1135 109/64     Pulse Rate  06/26/17 1135 (!) 105     Resp 06/26/17 1135 18     Temp 06/26/17 1135 98.8 F (37.1 C)     Temp Source 06/26/17 1135 Oral     SpO2 06/26/17 1135 99 %     Weight 06/26/17 1135 68 kg (150 lb)     Height 06/26/17 1135 1.651 m (5\' 5" )     Head Circumference --      Peak Flow --      Pain Score 06/26/17 1145 10     Pain Loc --      Pain Edu? --      Excl. in GC? --     Constitutional: Alert and oriented. Well appearing and in no acute distress. Eyes: Conjunctivae are normal. PERRL. EOMI. Head: Atraumatic. Nose: No congestion/rhinnorhea. Mouth/Throat: Mucous membranes are moist. Neck: No stridor.  No meningeal signs.   Cardiovascular: Normal rate, regular rhythm. Good peripheral circulation. Grossly normal heart sounds. Respiratory: Normal respiratory effort.  No retractions. Lungs CTAB. Gastrointestinal: Healthy body habitus.  Soft and nontender. No distention.  Musculoskeletal: No lower extremity tenderness nor edema. No gross deformities of extremities. Neurologic:  Normal speech and language. No gross focal neurologic deficits are appreciated.  Skin:  Skin is warm, dry and intact. No rash noted. Psychiatric: Mood and affect are normal. Speech and behavior are normal.  ____________________________________________   LABS (all labs ordered are listed, but only abnormal results are displayed)  Labs Reviewed - No data to display ____________________________________________  EKG  None - EKG not ordered by ED physician ____________________________________________  RADIOLOGY   ED MD interpretation:  No acute abnormalities  Official radiology report(s): Ct Head Wo Contrast  Result Date: 06/26/2017 CLINICAL DATA:  Headache for 3 months located in the frontal region with nausea and sensitivity to light and sound. Headache character is different than the patient's prior migraines. EXAM: CT HEAD WITHOUT CONTRAST TECHNIQUE: Contiguous axial images were obtained from the base of  the skull through the vertex without intravenous contrast. COMPARISON:  None. FINDINGS: Brain: There is no evidence of acute infarct, intracranial hemorrhage, mass, midline shift, or extra-axial fluid collection. The ventricles and sulci are normal. Vascular: No hyperdense vessel. Skull: No fracture or focal osseous lesion. Sinuses/Orbits: Visualized paranasal sinuses and mastoid air cells are clear. 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 The orbits are unremarkable. Other: None. IMPRESSION: Negative head CT. Electronically Signed   By: Sebastian AcheAllen  Grady M.D.   On: 06/26/2017 14:43    ____________________________________________   PROCEDURES  Critical Care performed: No   Procedure(s) performed:   Procedures   ____________________________________________   INITIAL IMPRESSION / ASSESSMENT AND PLAN / ED COURSE  As part of my medical decision making, I reviewed the following data within the electronic MEDICAL RECORD NUMBER Nursing notes reviewed and incorporated, Old chart reviewed and Notes from prior ED visits    Differential diagnosis includes, but is not limited to, intracranial hemorrhage, meningitis/encephalitis, previous head trauma, cavernous venous thrombosis, tension headache, temporal arteritis, migraine or migraine equivalent, idiopathic intracranial hypertension,  and non-specific headache.  The patient is very well-appearing and has had a headache for months.  She has plans to see a neurologist.  She has no other infectious symptoms or sinus symptoms or concerns or history of blood clots that would make me concerned about a venous thrombosis.  She has no focal neurological deficits or other signs or symptoms that are concerning for an acute or emergent medical condition.  I looked back through her records and [facility] and care everywhere and I see no indication she is ever received imaging.  I think it is appropriate to obtain a CT head to rule out any obvious structural  abnormalities such as a tumor, but I anticipate that treatment for migraine will at least help her feel better today until she can follow-up as an outpatient.  No indication for any lab work at this time.  Clinical Course as of Jun 26 1802  Wed Jun 26, 2017  1449 Radiologist reports no acute abnormalities on noncontrast head CT CT Head Wo Contrast [CF]  1602 The patient is sleeping comfortably when awakened she said that she feels quite a bit better than before  And is comfortable with the plan to go home.  I gave my usual and customary return precautions.   [CF]    Clinical Course User Index [CF] Loleta Rose, MD    ____________________________________________  FINAL CLINICAL IMPRESSION(S) / ED DIAGNOSES  Final diagnoses:  Intractable migraine with status migrainosus, unspecified migraine type     MEDICATIONS GIVEN DURING THIS VISIT:  Medications  diphenhydrAMINE (BENADRYL) injection 25 mg (25 mg Intravenous Given 06/26/17 1426)  sodium chloride 0.9 % bolus 1,000 mL (0 mLs Intravenous Stopped 06/26/17 1542)  haloperidol lactate (HALDOL) injection 5 mg (5 mg Intravenous Given 06/26/17 1427)  dexamethasone (DECADRON) injection 10 mg (10 mg Intravenous Given 06/26/17 1427)  magnesium sulfate IVPB 2 g 50 mL (0 g Intravenous Stopped 06/26/17 1542)     ED Discharge Orders    None       Note:  This document was prepared using Dragon voice recognition software and may include unintentional dictation errors.    Loleta Rose, MD 06/26/17 [phone]

## 2017-06-26 NOTE — Discharge Instructions (Signed)
You have been seen in the Emergency Department (ED) for a migraine or stress headache.  Your head CT scan was normal today.  Please use Tylenol or Motrin as needed for symptoms, but only as written on the box, and take any regular medications that have been prescribed for you.  As we have discussed, please follow up with your doctor as soon as possible regarding today?s ED visit and your headache symptoms.    Call your doctor or return to the Emergency Department (ED) if you have a worsening headache, sudden and severe headache, confusion, slurred speech, facial droop, weakness or numbness in any arm or leg, extreme fatigue, or other symptoms that concern you.

## 2017-06-26 NOTE — ED Triage Notes (Addendum)
Pt comes into the ED via POV c/o headache x 3 months.  Patient states she has a h/o migraines but this does not feel similar.  Headache is stated to be frontal.  Patient states she has nausea, photosensitivity and sensitivity to loud noises.  Patient in NAD at this time with even and unlabored respirations and is neurologically intact. Patient states she has an new patient appt with a neurologist in April.

## 2017-06-26 NOTE — ED Notes (Signed)
Pt alert and oriented X4, active, cooperative, pt in NAD. RR even and unlabored, color WNL.  Pt informed to return if any life threatening symptoms occur.  Discharge and followup instructions reviewed. Pt left with family.  Ambulates safely.

## 2017-06-26 NOTE — ED Notes (Signed)
Pt reporting frontal headache since this AM. Hx of migraines, took amitriptyline PTA. Photosensitivity and noise sensitivity. Pt alert and oriented X4, active, cooperative, pt in NAD. RR even and unlabored, color WNL.  Ambulatory.

## 2017-07-30 ENCOUNTER — Encounter: Payer: Self-pay | Admitting: Emergency Medicine

## 2017-07-30 ENCOUNTER — Emergency Department
Admission: EM | Admit: 2017-07-30 | Discharge: 2017-07-30 | Disposition: A | Discharge status: Home / Self Care | Payer: Medicaid Other | Attending: Emergency Medicine | Admitting: Emergency Medicine

## 2017-07-30 ENCOUNTER — Other Ambulatory Visit: Payer: Self-pay

## 2017-07-30 DIAGNOSIS — Z91040 Latex allergy status: Secondary | ICD-10-CM | POA: Diagnosis not present

## 2017-07-30 DIAGNOSIS — A084 Viral intestinal infection, unspecified: Secondary | ICD-10-CM | POA: Insufficient documentation

## 2017-07-30 DIAGNOSIS — F17210 Nicotine dependence, cigarettes, uncomplicated: Secondary | ICD-10-CM | POA: Diagnosis not present

## 2017-07-30 DIAGNOSIS — Z79899 Other long term (current) drug therapy: Secondary | ICD-10-CM | POA: Insufficient documentation

## 2017-07-30 DIAGNOSIS — R52 Pain, unspecified: Secondary | ICD-10-CM | POA: Diagnosis present

## 2017-07-30 MED ORDER — ONDANSETRON 4 MG PO TBDP
4.0000 mg | ORAL_TABLET | Freq: Once | ORAL | Status: AC
  Administered 2017-07-30: 4 mg via ORAL
  Filled 2017-07-30: qty 1

## 2017-07-30 MED ORDER — ACETAMINOPHEN 500 MG PO TABS
1000.0000 mg | ORAL_TABLET | Freq: Once | ORAL | Status: AC
  Administered 2017-07-30: 1000 mg via ORAL
  Filled 2017-07-30: qty 2

## 2017-07-30 MED ORDER — ONDANSETRON 4 MG PO TBDP: 4.0000 mg | ORAL_TABLET | Freq: Three times a day (TID) | ORAL | 0 refills | Status: DC | PRN

## 2017-07-30 NOTE — Discharge Instructions (Signed)
[facility] if any continued problems.  Continue taking Tylenol as needed for headache or body aches.  Zofran ODT every 8 hours as needed for nausea.  Clear liquids for the next 24 hours.  Then you can gradually add back foods such as bananas, rice, applesauce and plain toast.  Avoid dairy products for the next 72 hours as this causes a lot of gas.

## 2017-07-30 NOTE — ED Notes (Signed)
See triage note  States she developed body aches and headache since midnight  Also has had some vomiting and nasal congestion  States she has vomited times 3 afebrile on arrival

## 2017-07-30 NOTE — ED Provider Notes (Signed)
[facility] Emergency Department Provider Note  ____________________________________________   First MD Initiated Contact with Patient 07/30/17 [phone]     (approximate)  I have reviewed the triage vital signs and the nursing notes.   HISTORY  Chief Complaint flu like symptoms  HPI Laura Pitts is a 22 y.o. female is here with complaint of body aches, nasal congestion and fatigue.  Patient states his symptoms started approximately 12 AM.  She also complains of diarrhea x2-3 episodes yesterday and vomiting today.  Patient still feels nauseous.  She has not taken anything for fever and denies any fever or chills at home.  She rates her pain as a 10/10.   Past Medical History:  Diagnosis Date  . Anxiety   . Bipolar affective disorder (HCC)   . Depression   . Migraine     Patient Active Problem List   Diagnosis Date Noted  . B12 deficiency 09/24/2016  . MDD (major depressive disorder), recurrent episode, severe (HCC) 09/21/2016  . PTSD (post-traumatic stress disorder) 09/21/2016  . Tobacco use disorder 09/21/2016  . Chronic migraine 02/24/2014    History reviewed. No pertinent surgical history.  Prior to Admission medications   Medication Sig Start Date End Date Taking? Authorizing Provider  amitriptyline (ELAVIL) 75 MG tablet Take 1 tablet (75 mg total) by mouth at bedtime. 09/24/16   Jimmy FootmanHernandez-Gonzalez, Andrea, MD  Dextromethorphan-Quinidine (NUEDEXTA) 20-10 MG CAPS Take by mouth.    [provider]  lamoTRIgine (LAMICTAL) 25 MG tablet Take 50 mg by mouth 2 (two) times daily.    [provider]  meloxicam (MOBIC) 15 MG tablet Take 15 mg by mouth daily. Take 1 tablet qd with meals    [provider]  ondansetron (ZOFRAN ODT) 4 MG disintegrating tablet Take 1 tablet (4 mg total) by mouth every 8 (eight) hours as needed for nausea or vomiting. 07/30/17   Tommi RumpsSummers, Zakiah Beckerman L, PA-C  venlafaxine XR (EFFEXOR-XR) 150 MG 24 hr  capsule Take 1 capsule (150 mg total) by mouth daily with breakfast. 09/25/16   Jimmy FootmanHernandez-Gonzalez, Andrea, MD    Allergies Latex  Family History  Problem Relation Age of Onset  . Migraines Mother   . Hypertension Father   . Brain cancer Maternal Grandmother   . Lung cancer Maternal Grandfather     Social History Social History   Tobacco Use  . Smoking status: Current Every Day Smoker    Packs/day: 0.50    Types: Cigarettes  . Smokeless tobacco: Never Used  Substance Use Topics  . Alcohol use: Yes  . Drug use: Yes    Types: Marijuana    Review of Systems Constitutional: No fever/chills Eyes: No visual changes. ENT: No sore throat.  Negative for ear pain. Cardiovascular: Denies chest pain. Respiratory: Denies shortness of breath. Gastrointestinal: No abdominal pain.  Positive nausea, positive vomiting.  Positive diarrhea.   Genitourinary: Negative for dysuria. Musculoskeletal: Positive for body aches. Skin: Negative for rash. Neurological: Negative for headaches, focal weakness or numbness. ___________________________________________   PHYSICAL EXAM:  VITAL SIGNS: ED Triage Vitals  Enc Vitals Group     BP 07/30/17 0826 112/64     Pulse Rate 07/30/17 0826 79     Resp 07/30/17 0826 20     Temp 07/30/17 0826 97.9 F (36.6 C)     Temp Source 07/30/17 0826 Oral     SpO2 07/30/17 0826 99 %     Weight 07/30/17 0826 150 lb (68 kg)  Height 07/30/17 0826 5\' 5"  (1.651 m)     Head Circumference --      Peak Flow --      Pain Score 07/30/17 0836 10     Pain Loc --      Pain Edu? --      Excl. in GC? --    Constitutional: Alert and oriented. Well appearing and in no acute distress. Eyes: Conjunctivae are normal.  Head: Atraumatic. Nose: No congestion/rhinnorhea. Mouth/Throat: Mucous membranes are moist.  Oropharynx non-erythematous. Neck: No stridor.   Hematological/Lymphatic/Immunilogical: No cervical lymphadenopathy. Cardiovascular: Normal rate, regular  rhythm. Grossly normal heart sounds.  Good peripheral circulation. Respiratory: Normal respiratory effort.  No retractions. Lungs CTAB. Gastrointestinal: Soft and nontender. No distention.  Bowel sounds are normoactive x4 quadrants at this time. Musculoskeletal: Moves upper and lower extremities without any difficulty.  Normal gait was noted. Neurologic:  Normal speech and language. No gross focal neurologic deficits are appreciated.  Skin:  Skin is warm, dry and intact. No rash noted. Psychiatric: Mood and affect are normal. Speech and behavior are normal.  ____________________________________________   LABS (all labs ordered are listed, but only abnormal results are displayed)  Labs Reviewed - No data to display   PROCEDURES  Procedure(s) performed: None  Procedures  Critical Care performed: No  ____________________________________________   INITIAL IMPRESSION / ASSESSMENT AND PLAN / ED COURSE Patient was given Zofran while in the department and had relief of her nausea.  She was given Tylenol 1 g p.o. while in the department for muscle aches and headache.  She is encouraged to drink clear fluids for the next 24 hours and gradually add back food.  Patient was given a note to remain out of work for the next 2 days.  He is to follow-up with her primary care if any continued problems.  ____________________________________________   FINAL CLINICAL IMPRESSION(S) / ED DIAGNOSES  Final diagnoses:  Viral gastroenteritis     ED Discharge Orders        Ordered    ondansetron (ZOFRAN ODT) 4 MG disintegrating tablet  Every 8 hours PRN     07/30/17 0920       Note:  This document was prepared using Dragon voice recognition software and may include unintentional dictation errors.    Tommi Rumps, PA-C 07/30/17 1117    Jeanmarie Plant, MD 07/30/17 [phone]

## 2017-07-30 NOTE — ED Triage Notes (Signed)
Pt to ED via POV c/o body aches, nasal congestion, and fatigue. Pt states that she has had her sx's since this morning around 12 am. Pt in NAD at this time.

## 2017-12-12 ENCOUNTER — Encounter: Payer: Self-pay | Admitting: Obstetrics and Gynecology

## 2018-01-03 ENCOUNTER — Ambulatory Visit: Payer: Medicaid Other | Admitting: Obstetrics and Gynecology

## 2018-01-03 NOTE — Telephone Encounter (Signed)
Patient schedule 01/03/18 ta 4:00 with [facility]

## 2018-01-03 NOTE — Telephone Encounter (Signed)
Depends. If she is also having severe pain at [employer] site, might need to be a work-in.  If there is a work-in spot, ok to work in.

## 2018-01-06 ENCOUNTER — Ambulatory Visit (INDEPENDENT_AMBULATORY_CARE_PROVIDER_SITE_OTHER): Payer: Medicaid Other | Admitting: Obstetrics and Gynecology

## 2018-01-06 ENCOUNTER — Encounter: Payer: Self-pay | Admitting: Obstetrics and Gynecology

## 2018-01-06 VITALS — BP 110/62 | HR 78 | Ht 65.0 in | Wt 153.0 lb

## 2018-01-06 DIAGNOSIS — M940 Chondrocostal junction syndrome [Tietze]: Secondary | ICD-10-CM | POA: Diagnosis not present

## 2018-01-06 DIAGNOSIS — N912 Amenorrhea, unspecified: Secondary | ICD-10-CM

## 2018-01-06 DIAGNOSIS — Z3202 Encounter for pregnancy test, result negative: Secondary | ICD-10-CM | POA: Diagnosis not present

## 2018-01-06 DIAGNOSIS — M79622 Pain in left upper arm: Secondary | ICD-10-CM | POA: Diagnosis not present

## 2018-01-06 LAB — POCT URINE PREGNANCY: Preg Test, Ur: NEGATIVE

## 2018-01-06 NOTE — Progress Notes (Signed)
Obstetrics & Gynecology Office Visit   Chief Complaint  Patient presents with  . Arm Pain    pt started bleeding 8/3-8/12 and the pain started during the bleeding and has continue. Pain rate :2, concerned it is the nexplanon  . Breast Problem    right breast pain, unknown injury    History of Present Illness: 22 y.o. 131P1001 female who presents for two issues. The first is that she is having sharp pains in her left arm. She had a period on July 16-25.  She started bleeding again 8/3-8/12. With the period she had on 8/3, she noted sharp, shooting pain in her left arm in the bicipital groove.  The pain today is just sore, but not as bad.  She denies any other neurologic issues. She has sensation in her left hand and arm and is able to move her distal arm and hand.  She does not recall a trauma to her arm.  Nothing made the pain worse apart from pushing on the arm.  Alleviating factors also included putting pressure on the area.  The area is in the location of her Nexplanon.  Associated symptoms include feeling tired.  She normally gets sort of tired when she starts her periods.  Since she had the Nexplanon last September she has had a monthly menses lasting 4-5 days.  The past two menses have been much longer.  She did take a home pregnancy test on 8/13 and it was negative.    The other issues is breast pain in her right breast.  She points to her right breast medially in the upper and lower inner quadrants extending to the lower outer quadrant. She denies trauma to her breast.  She noted breast sensitivity in early July.  She correlates the tenderness to when she had bleeding.  She notes tenderness in both breast. She denies discharge or bleeding in her nipples, bilaterally. She does not think she has had any skin changes in her right breast.  She denies starting any new exercise regimens.    Past Medical History:  Diagnosis Date  . Anxiety   . Bipolar affective disorder (HCC)   . Depression     . Migraine     Past Surgical History:  Procedure Laterality Date  . NO PAST SURGERIES      Gynecologic History: No LMP recorded. Patient has had an implant.  Obstetric History: G1P1001  Family History  Problem Relation Age of Onset  . Migraines Mother   . Hypertension Father   . Brain cancer Maternal Grandmother   . Lung cancer Maternal Grandfather     Social History   Socioeconomic History  . Marital status: Single    Spouse name: Not on file  . Number of children: Not on file  . Years of education: Not on file  . Highest education level: Not on file  Occupational History  . Not on file  Social Needs  . Financial resource strain: Not on file  . Food insecurity:    Worry: Not on file    Inability: Not on file  . Transportation needs:    Medical: Not on file    Non-medical: Not on file  Tobacco Use  . Smoking status: Current Every Day Smoker    Packs/day: 0.50    Types: Cigarettes  . Smokeless tobacco: Never Used  Substance and Sexual Activity  . Alcohol use: Yes  . Drug use: Yes    Types: Marijuana  . Sexual activity: Yes  Birth control/protection: Pill  Lifestyle  . Physical activity:    Days per week: Not on file    Minutes per session: Not on file  . Stress: Not on file  Relationships  . Social connections:    Talks on phone: Not on file    Gets together: Not on file    Attends religious service: Not on file    Active member of club or organization: Not on file    Attends meetings of clubs or organizations: Not on file    Relationship status: Not on file  . Intimate partner violence:    Fear of current or ex partner: Not on file    Emotionally abused: Not on file    Physically abused: Not on file    Forced sexual activity: Not on file  Other Topics Concern  . Not on file  Social History Narrative  . Not on file    Allergies  Allergen Reactions  . Latex   . Venlafaxine Other (See Comments)    Too tired Too tired    Prior to  Admission medications   Medication Sig Start Date End Date Taking? Authorizing Provider  gabapentin (NEURONTIN) 100 MG capsule TK 1 C PO BID 12/10/17  Yes [provider]  amitriptyline (ELAVIL) 75 MG tablet Take 1 tablet (75 mg total) by mouth at bedtime. Patient not taking: Reported on 01/06/2018 09/24/16   Jimmy Footman, MD  Dextromethorphan-Quinidine (NUEDEXTA) 20-10 MG CAPS Take by mouth.    [provider]  lamoTRIgine (LAMICTAL) 25 MG tablet Take 50 mg by mouth 2 (two) times daily.    [provider]  meloxicam (MOBIC) 15 MG tablet Take 15 mg by mouth daily. Take 1 tablet qd with meals    [provider]  ondansetron (ZOFRAN ODT) 4 MG disintegrating tablet Take 1 tablet (4 mg total) by mouth every 8 (eight) hours as needed for nausea or vomiting. Patient not taking: Reported on 01/06/2018 07/30/17   Tommi Rumps, PA-C  sertraline (ZOLOFT) 100 MG tablet sertraline 100 mg tablet  TK 1 T PO D    [provider]  venlafaxine XR (EFFEXOR-XR) 150 MG 24 hr capsule Take 1 capsule (150 mg total) by mouth daily with breakfast. Patient not taking: Reported on 01/06/2018 09/25/16   Jimmy Footman, MD    Review of Systems  Constitutional: Negative.   HENT: Negative.   Eyes: Negative.   Respiratory: Negative.   Cardiovascular: Negative.   Gastrointestinal: Negative.   Genitourinary: Negative.   Musculoskeletal: Negative.        Left arm pain, breast pain in right breast  Skin: Negative.   Neurological: Negative.   Psychiatric/Behavioral: Negative.      Physical Exam BP 110/62   Pulse 78   Ht 5\' 5"  (1.651 m)   Wt 153 lb (69.4 kg)   SpO2 98%   BMI 25.46 kg/m  No LMP recorded. Patient has had an implant. Physical Exam  Constitutional: She is oriented to person, place, and time. She appears well-developed and well-nourished. No distress.  HENT:  Head: Normocephalic and atraumatic.  Eyes: EOM are normal. No scleral  icterus.  Neck: Normal range of motion. Neck supple.  Cardiovascular: Normal rate and regular rhythm.  Pulmonary/Chest: Effort normal and breath sounds normal. No respiratory distress. She has no wheezes. She has no rales. She exhibits tenderness (see image). Right breast exhibits tenderness. Right breast exhibits no inverted nipple, no mass, no nipple discharge and no skin change. Left  breast exhibits no inverted nipple, no mass, no nipple discharge, no skin change and no tenderness.    Abdominal: Soft. Bowel sounds are normal. She exhibits no distension and no mass. There is no tenderness. There is no rebound and no guarding.  Musculoskeletal: Normal range of motion. She exhibits no edema.       Arms: Neurological: She is alert and oriented to person, place, and time. No cranial nerve deficit.  Skin: Skin is warm and dry. No erythema.  Psychiatric: She has a normal mood and affect. Her behavior is normal. Judgment normal.    Female chaperone present for pelvic and breast  portions of the physical exam  Urine Pregnancy Test: negative   Assessment: 22 y.o. G48P1001 female here for  1. Amenorrhea   2. Acute costochondritis   3. Pain in left upper arm      Plan: Problem List Items Addressed This Visit    None    Visit Diagnoses    Amenorrhea    -  Primary   Relevant Orders   POCT urine pregnancy (Completed)   Acute costochondritis       Pain in left upper arm         Costochondritis, right chest wall: reassured patient that no abnormal findings in the breast tissue were present. Discussed management of costochondritis with NSAIDs and modified activity.  Follow up PRN.  Left arm pain: no etiology found.  Continue to monitor.  Nexplanon appears to be in place and intact without evidence of migration of infection.    15 minutes spent in face to face discussion with > 50% spent in counseling,management, and coordination of care of her costochondritis and left arm pain. She has a  follow up in 2 weeks for her annual visit. She was encouraged to keep this appointment.  Thomasene Mohair, MD 01/07/2018 12:49 PM

## 2018-01-07 ENCOUNTER — Encounter: Payer: Self-pay | Admitting: Obstetrics and Gynecology

## 2018-01-21 ENCOUNTER — Ambulatory Visit: Payer: Medicaid Other | Admitting: Obstetrics and Gynecology

## 2018-01-31 ENCOUNTER — Ambulatory Visit: Payer: Medicaid Other | Admitting: Obstetrics and Gynecology

## 2018-02-10 ENCOUNTER — Ambulatory Visit: Payer: Medicaid Other | Admitting: Obstetrics and Gynecology

## 2018-02-19 ENCOUNTER — Other Ambulatory Visit (HOSPITAL_COMMUNITY)
Admission: RE | Admit: 2018-02-19 | Discharge: 2018-02-19 | Disposition: A | Discharge status: Home / Self Care | Payer: Medicaid Other | Source: Ambulatory Visit | Attending: Obstetrics and Gynecology | Admitting: Obstetrics and Gynecology

## 2018-02-19 ENCOUNTER — Ambulatory Visit (INDEPENDENT_AMBULATORY_CARE_PROVIDER_SITE_OTHER): Payer: Medicaid Other | Admitting: Obstetrics and Gynecology

## 2018-02-19 ENCOUNTER — Encounter: Payer: Self-pay | Admitting: Obstetrics and Gynecology

## 2018-02-19 VITALS — BP 114/70 | Ht 65.0 in | Wt 158.0 lb

## 2018-02-19 DIAGNOSIS — Z1331 Encounter for screening for depression: Secondary | ICD-10-CM | POA: Diagnosis not present

## 2018-02-19 DIAGNOSIS — Z113 Encounter for screening for infections with a predominantly sexual mode of transmission: Secondary | ICD-10-CM | POA: Diagnosis not present

## 2018-02-19 DIAGNOSIS — Z0000 Encounter for general adult medical examination without abnormal findings: Secondary | ICD-10-CM

## 2018-02-19 DIAGNOSIS — Z1339 Encounter for screening examination for other mental health and behavioral disorders: Secondary | ICD-10-CM | POA: Diagnosis not present

## 2018-02-19 DIAGNOSIS — Z01419 Encounter for gynecological examination (general) (routine) without abnormal findings: Secondary | ICD-10-CM

## 2018-02-19 DIAGNOSIS — Z124 Encounter for screening for malignant neoplasm of cervix: Secondary | ICD-10-CM

## 2018-02-19 NOTE — Progress Notes (Signed)
Gynecology Annual Exam  PCP: [facility]  Chief Complaint  Patient presents with  . Annual Exam   History of Present Illness:  Ms. Laura Pitts is a 22 y.o. G1P1001 who LMP was Patient's last menstrual period was 01/22/2018., presents today for her annual examination.  Her menses are monthly, lasting about a week.  Initially, they lasted 3-4 days.  Dysmenorrhea none.   She is single partner, contraception - Nexplanon.  Last Pap: 2 years ago  Results were: no abnormalities /neg HPV DNA not done due to age Hx of STDs: none  There is no FH of breast cancer. There is no FH of ovarian cancer. The patient does do self-breast exams.  Tobacco use: current, everyday use (0.5 ppd). Alcohol use: most days Exercise: not active  The patient wears seatbelts: yes.   The patient reports that domestic violence in her life is absent.   Past Medical History:  Diagnosis Date  . Anxiety   . Bipolar affective disorder (HCC)   . Depression   . Migraine     Past Surgical History:  Procedure Laterality Date  . NO PAST SURGERIES      Current Outpatient Medications on File Prior to Visit  Medication Sig Dispense Refill  . Vilazodone HCl (VIIBRYD) 40 MG TABS Take 40 mg by mouth daily.    Marland Kitchen gabapentin (NEURONTIN) 100 MG capsule TK 1 C PO BID  0   Current Facility-Administered Medications on File Prior to Visit  Medication Dose Route Frequency Provider Last Rate Last Dose  . etonogestrel (NEXPLANON) implant 68 mg  68 mg Subdermal Once Conard Novak, MD         Allergies  Allergen Reactions  . Latex   . Venlafaxine Other (See Comments)    Too tired Too tired   Obstetric History: G1P1001  Social History   Socioeconomic History  . Marital status: Single    Spouse name: Not on file  . Number of children: Not on file  . Years of education: Not on file  . Highest education level: Not on file  Occupational History  . Not on file  Social Needs  . Financial  resource strain: Not on file  . Food insecurity:    Worry: Not on file    Inability: Not on file  . Transportation needs:    Medical: Not on file    Non-medical: Not on file  Tobacco Use  . Smoking status: Current Every Day Smoker    Packs/day: 0.50    Types: Cigarettes  . Smokeless tobacco: Never Used  Substance and Sexual Activity  . Alcohol use: Yes  . Drug use: Yes    Types: Marijuana  . Sexual activity: Yes    Birth control/protection: Pill  Lifestyle  . Physical activity:    Days per week: Not on file    Minutes per session: Not on file  . Stress: Not on file  Relationships  . Social connections:    Talks on phone: Not on file    Gets together: Not on file    Attends religious service: Not on file    Active member of club or organization: Not on file    Attends meetings of clubs or organizations: Not on file    Relationship status: Not on file  . Intimate partner violence:    Fear of current or ex partner: Not on file    Emotionally abused: Not on file    Physically abused: Not on  file    Forced sexual activity: Not on file  Other Topics Concern  . Not on file  Social History Narrative  . Not on file    Family History  Problem Relation Age of Onset  . Migraines Mother   . Hypertension Father   . Brain cancer Maternal Grandmother   . Lung cancer Maternal Grandfather     Review of Systems  Constitutional: Negative.   HENT: Negative.   Eyes: Negative.   Respiratory: Negative.   Cardiovascular: Negative.   Gastrointestinal: Negative.   Genitourinary: Negative.   Musculoskeletal: Negative.   Skin: Negative.   Neurological: Negative.   Psychiatric/Behavioral: Negative.      Physical Exam BP 114/70   Ht 5\' 5"  (1.651 m)   Wt 158 lb (71.7 kg)   LMP 01/22/2018   BMI 26.29 kg/m    Physical Exam  Constitutional: She is oriented to person, place, and time. She appears well-developed and well-nourished. No distress.  Genitourinary: Uterus normal.  Pelvic exam was performed with patient supine. There is no rash, tenderness, lesion or injury on the right labia. There is no rash, tenderness, lesion or injury on the left labia. No erythema, tenderness or bleeding in the vagina. No signs of injury around the vagina. No vaginal discharge found. Right adnexum does not display mass, does not display tenderness and does not display fullness. Left adnexum does not display mass, does not display tenderness and does not display fullness. Cervix does not exhibit motion tenderness, lesion, discharge or polyp.   Uterus is mobile and anteverted. Uterus is not enlarged, tender or exhibiting a mass.  HENT:  Head: Normocephalic and atraumatic.  Eyes: EOM are normal. No scleral icterus.  Neck: Normal range of motion. Neck supple. No thyromegaly present.  Cardiovascular: Normal rate and regular rhythm. Exam reveals no gallop and no friction rub.  No murmur heard. Pulmonary/Chest: Effort normal and breath sounds normal. No respiratory distress. She has no wheezes. She has no rales.  Abdominal: Soft. Bowel sounds are normal. She exhibits no distension and no mass. There is no tenderness. There is no rebound and no guarding.  Musculoskeletal: Normal range of motion. She exhibits no edema or tenderness.  Lymphadenopathy:    She has no cervical adenopathy.       Right: No inguinal adenopathy present.       Left: No inguinal adenopathy present.  Neurological: She is alert and oriented to person, place, and time. No cranial nerve deficit.  Skin: Skin is warm and dry. No rash noted. No erythema.  Psychiatric: She has a normal mood and affect. Her behavior is normal. Judgment normal.   Female chaperone present for pelvic and breast  portions of the physical exam  Results: AUDIT Questionnaire (screen for alcoholism): 7 PHQ-9: 13   Assessment: 22 y.o. G1P1001 female here for routine annual gynecologic examination  Plan: Problem List Items Addressed This Visit     None    Visit Diagnoses    Women's annual routine gynecological examination    -  Primary   Relevant Orders   Cytology - PAP   Screening for depression       Screening for alcoholism       Pap smear for cervical cancer screening       Relevant Orders   Cytology - PAP   Screen for STD (sexually transmitted disease)       Relevant Orders   Cytology - PAP      Screening: -- Blood pressure  screen normal -- Weight screening: normal -- Depression screening negative (PHQ-9) -- Nutrition: normal -- cholesterol screening: not due for screening -- osteoporosis screening: not due -- tobacco screening: using: discussed quitting using the 5 A's -- alcohol screening: AUDIT questionnaire indicates low-risk usage. -- family history of breast cancer screening: done. not at high risk. -- no evidence of domestic violence or intimate partner violence. -- STD screening: gonorrhea/chlamydia NAAT collected -- pap smear collected per ASCCP guidelines -- flu vaccine declines -- HPV vaccination series: Discussed. Patient would like to consider.   Thomasene Mohair, MD 02/19/2018 9:29 AM

## 2018-02-21 LAB — CYTOLOGY - PAP
Chlamydia: NEGATIVE
Diagnosis: NEGATIVE
Neisseria Gonorrhea: NEGATIVE

## 2018-05-27 ENCOUNTER — Encounter: Payer: Self-pay | Admitting: Obstetrics and Gynecology

## 2018-05-28 ENCOUNTER — Encounter: Payer: Self-pay | Admitting: Obstetrics and Gynecology

## 2018-05-28 ENCOUNTER — Ambulatory Visit (INDEPENDENT_AMBULATORY_CARE_PROVIDER_SITE_OTHER): Payer: Medicaid Other | Admitting: Obstetrics and Gynecology

## 2018-05-28 VITALS — BP 113/71 | HR 80 | Wt 159.0 lb

## 2018-05-28 DIAGNOSIS — N921 Excessive and frequent menstruation with irregular cycle: Secondary | ICD-10-CM | POA: Diagnosis not present

## 2018-05-28 MED ORDER — ESTRADIOL 1 MG PO TABS: 1.0000 mg | ORAL_TABLET | Freq: Every day | ORAL | 3 refills | Status: DC

## 2018-05-28 NOTE — Progress Notes (Signed)
Obstetrics & Gynecology Office Visit   Chief Complaint  Patient presents with  . Menometrorrhagia  . Pelvic Pain    sproadic both sides   History of Present Illness: 23 y.o. G74P1001 female who presents with irregular bleeding . She has been having 26 consecutive days of at least spotting.  She has a Nexplanon since 02/06/2017.  Since placement of the device her menses are somewhat regular and usually lighter.  She has had no intercourse since August.  So, she does not believe she is pregnant.  Prior to this, things were fine and she had no issues.  She has lost no weight, taken on a new exercise program.  She takes a low-dose of gabapentin and Viibryd for depression. The last time she had intercourse was August. She had a negative STD screen in 02/2018.  She denies abnormal discharge or any other concerning symptoms.  She also has some pain that shoots through her bilateral lower quadrants.  When she is sitting for a while and then moves she can have a spasm in her left side that goes away after a few moments.  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   Past Medical History:  Diagnosis Date  . Anxiety   . Bipolar affective disorder (HCC)   . Depression   . Migraine    Past Surgical History:  Procedure Laterality Date  . INTRAUTERINE DEVICE (IUD) INSERTION  11/07/12, 01/19/2015   [place]  . NO PAST SURGERIES    . tubes in ears     Gynecologic History: Patient's last menstrual period was 05/01/2018.  Obstetric History: G1P1001, s/p SVD x 1  Family History  Problem Relation Age of Onset  . Migraines Mother   . Hypertension Father   . Brain cancer Maternal Grandmother   . Lung cancer Maternal Grandfather    Social History   Socioeconomic History  . Marital status: Single    Spouse name: Not on file  . Number of children: Not on file  . Years of education: Not on file  . Highest education level: Not on file  Occupational History  . Not on file  Social  Needs  . Financial resource strain: Not on file  . Food insecurity:    Worry: Not on file    Inability: Not on file  . Transportation needs:    Medical: Not on file    Non-medical: Not on file  Tobacco Use  . Smoking status: Current Every Day Smoker    Packs/day: 0.50    Types: Cigarettes  . Smokeless tobacco: Never Used  Substance and Sexual Activity  . Alcohol use: Yes  . Drug use: Yes    Types: Marijuana  . Sexual activity: Yes    Birth control/protection: Pill  Lifestyle  . Physical activity:    Days per week: Not on file    Minutes per session: Not on file  . Stress: Not on file  Relationships  . Social connections:    Talks on phone: Not on file    Gets together: Not on file    Attends religious service: Not on file    Active member of club or organization: Not on file    Attends meetings of clubs or organizations: Not on file    Relationship status: Not on file  . Intimate partner violence:    Fear of current or ex partner: Not on file    Emotionally abused: Not on file    Physically abused:  Not on file    Forced sexual activity: Not on file  Other Topics Concern  . Not on file  Social History Narrative  . Not on file   Allergies  Allergen Reactions  . Latex   . Venlafaxine Other (See Comments)    Too tired Too tired   Prior to Admission medications   Medication Sig Start Date End Date Taking? Authorizing Provider  gabapentin (NEURONTIN) 100 MG capsule TK 1 C PO BID 12/10/17  Yes [provider]  traZODone (DESYREL) 100 MG tablet TK 1 T PO 1 TIME HS 05/12/18  Yes [provider]  Vilazodone HCl (VIIBRYD) 40 MG TABS Take 40 mg by mouth daily.   Yes [provider]   Review of Systems  Constitutional: Negative.   HENT: Negative.   Eyes: Negative.   Respiratory: Negative.   Cardiovascular: Negative.   Gastrointestinal: Negative.   Genitourinary: Negative.        See HPI  Musculoskeletal: Negative.        See HPI  Skin:  Negative.   Neurological: Negative.   Psychiatric/Behavioral: Negative.     Physical Exam BP 113/71   Pulse 80   Wt 159 lb (72.1 kg)   LMP 05/01/2018   BMI 26.46 kg/m  Patient's last menstrual period was 05/01/2018. Physical Exam Constitutional:      Appearance: Normal appearance. She is not ill-appearing.  HENT:     Head: Normocephalic and atraumatic.  Eyes:     General: No scleral icterus.    Conjunctiva/sclera: Conjunctivae normal.  Pulmonary:     Effort: Pulmonary effort is normal. No respiratory distress.  Musculoskeletal: Normal range of motion.        General: No swelling or tenderness.  Neurological:     General: No focal deficit present.     Mental Status: She is alert and oriented to person, place, and time.     Cranial Nerves: No cranial nerve deficit.  Skin:    General: Skin is warm and dry.  Psychiatric:        Mood and Affect: Mood normal.        Behavior: Behavior normal.        Judgment: Judgment normal.    Assessment: 23 y.o. G40P1001 female here for  1. Menorrhagia with irregular cycle      Plan: Problem List Items Addressed This Visit    None    Visit Diagnoses    Menorrhagia with irregular cycle    -  Primary   Relevant Medications   estradiol (ESTRACE) 1 MG tablet     Will try a very low dose of estrogen if she has bleeding that lasts for greater than 7 days.  She will only take one pill per day for seven days to try to stem her bleeding.  She understands there is an increased risk of stroke and VTE with estrogen, especially in a woman who has migraine headaches.  So, we will use this technique very sparingly. We did discuss removal of the Nexplanon. However, her only options at that point would still be progesterone-only or a Paraguard IUD (copper based).  She voiced agreement with this plan for now.   20 minutes spent in face to face discussion with > 50% spent in counseling,management, and coordination of care of her menorrhagia with irregular  cycle.   Thomasene Mohair, MD 05/28/2018 10:05 AM

## 2018-10-07 ENCOUNTER — Other Ambulatory Visit: Payer: Self-pay

## 2018-10-07 ENCOUNTER — Ambulatory Visit: Payer: Medicaid Other | Admitting: Obstetrics and Gynecology

## 2018-10-09 ENCOUNTER — Ambulatory Visit: Payer: Medicaid Other | Admitting: Obstetrics and Gynecology

## 2019-01-05 IMAGING — CT CT HEAD W/O CM
3 series · 15 of 47 positions shown, 18 images · non-contrast
Comparison: None.

CLINICAL DATA: Headache for 3 months located in the frontal region
with nausea and sensitivity to light and sound. Headache character
is different than the patient's prior migraines.

EXAM:
CT HEAD WITHOUT CONTRAST
TECHNIQUE: Contiguous axial images were obtained from the base of the skull
through the vertex without intravenous contrast.

[Series 3: head wo · axial · 0.42mm/px · z∈[-68,+57]mm · 9 of 30 slices shown, 12 images]
[im 3/30  brain]
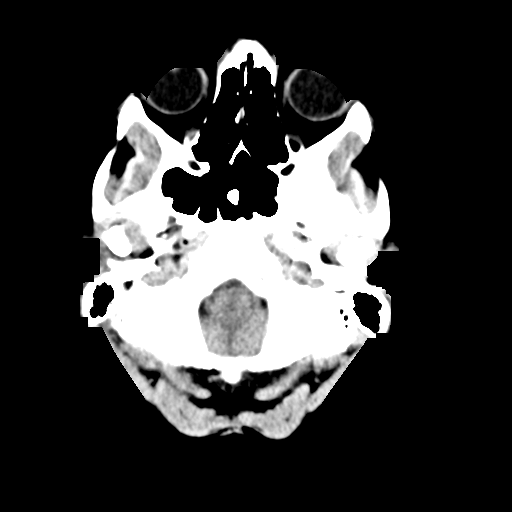
[im 3/30  bone]
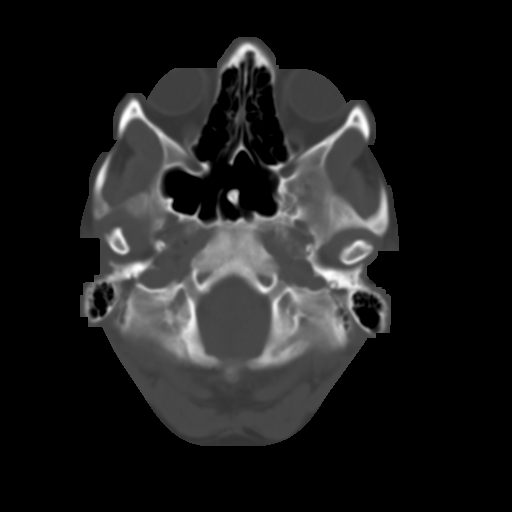
[im 6/30  brain]
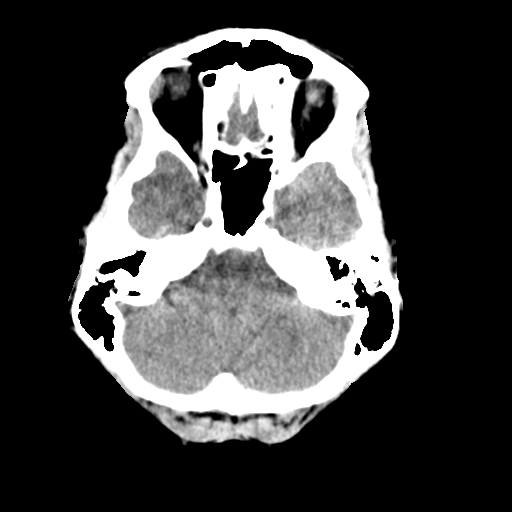
[im 9/30  brain]
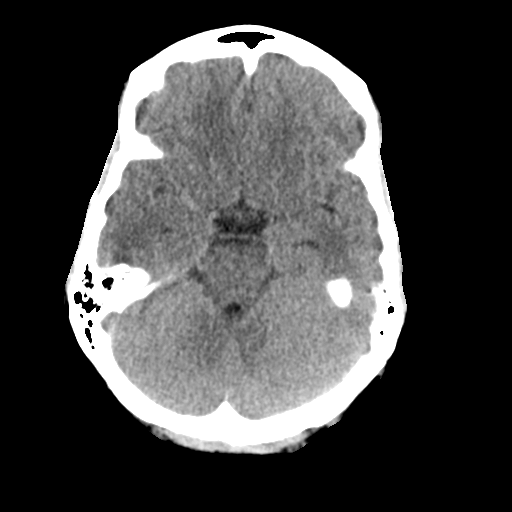
[im 12/30  brain]
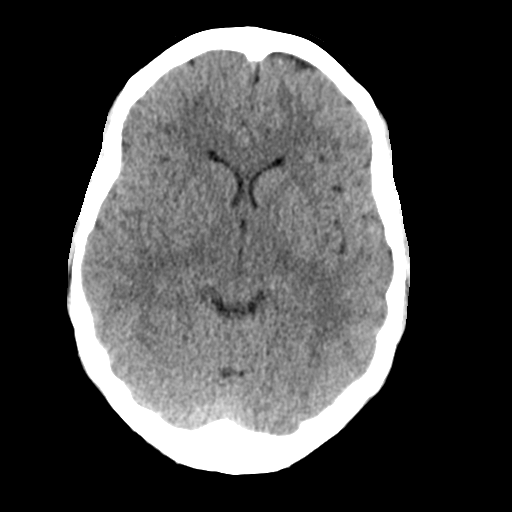
[im 16/30  brain]
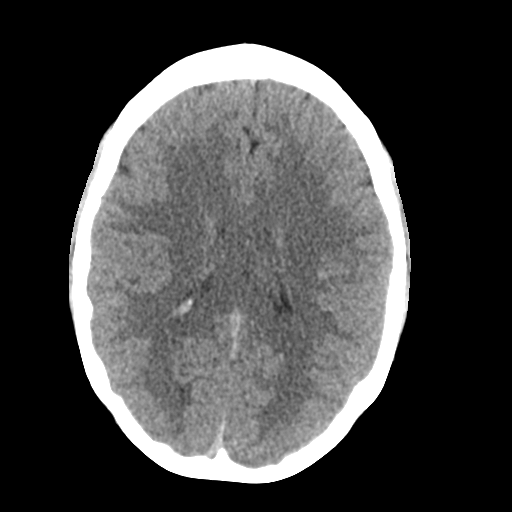
[im 16/30  bone]
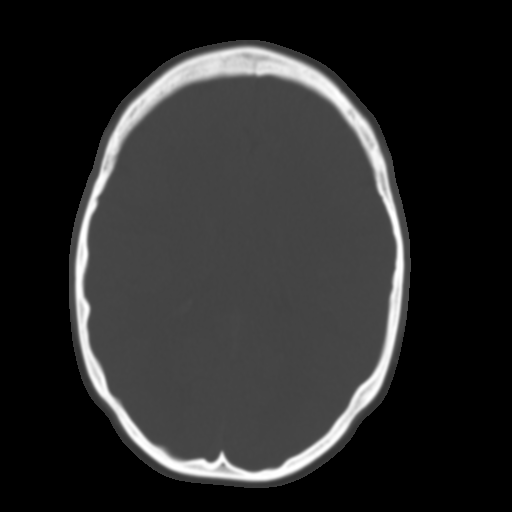
[im 19/30  brain]
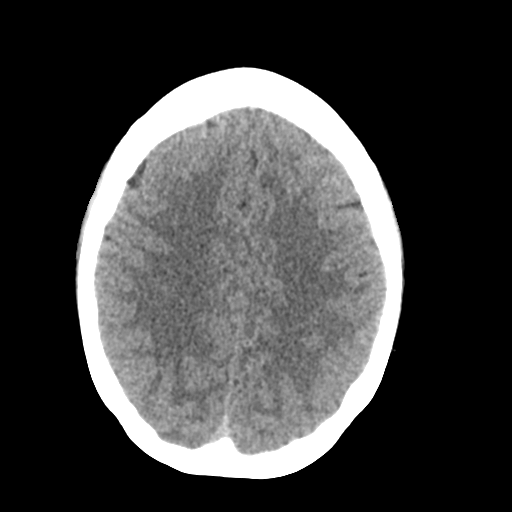
[im 22/30  brain]
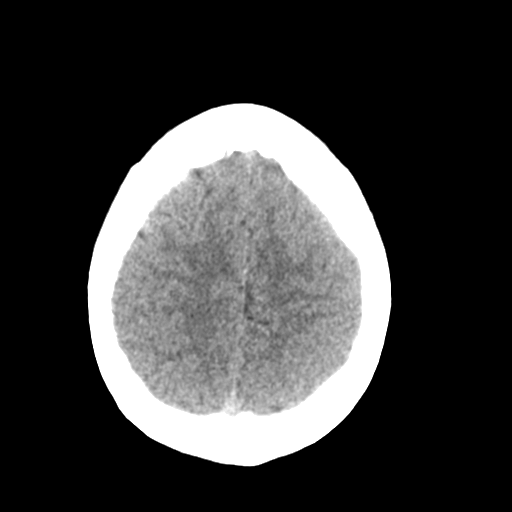
[im 25/30  brain]
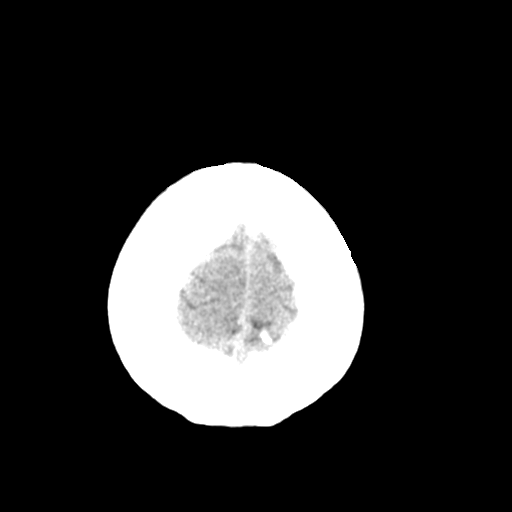
[im 28/30  brain]
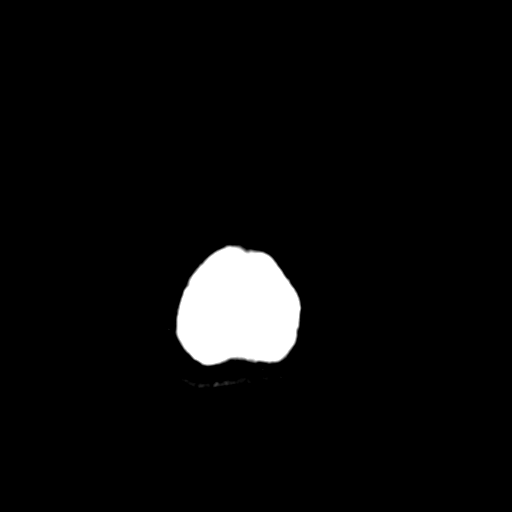
[im 28/30  bone]
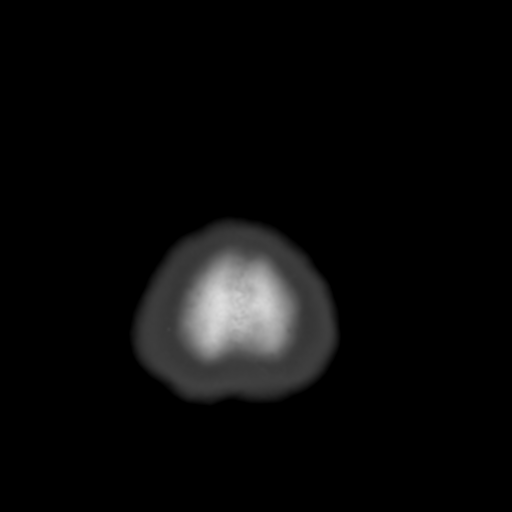

[Series 4: coronal soft tissue · coronal · 0.32mm/px · 3 of 68 slices shown]
[im 23/68  brain]
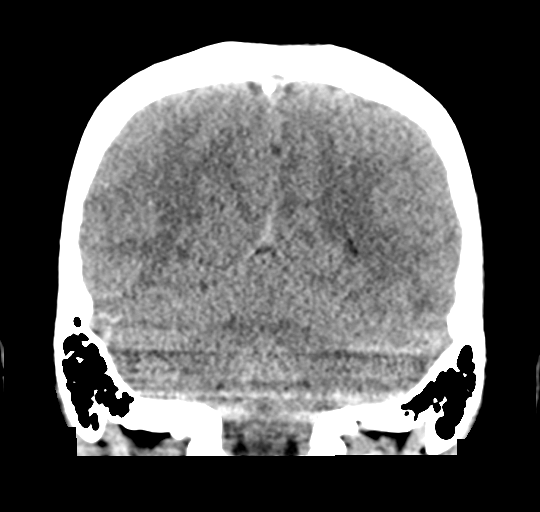
[im 30/68  brain]
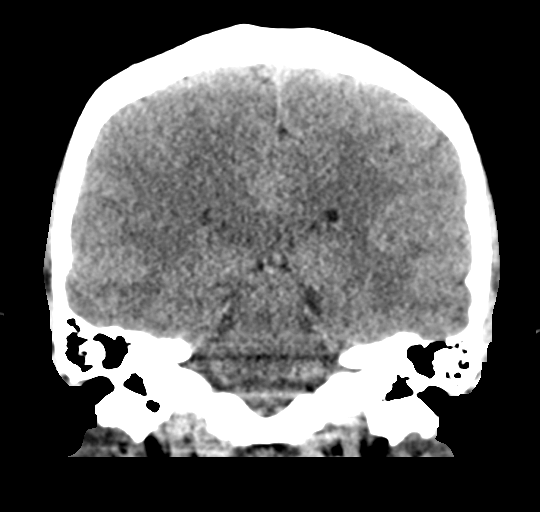
[im 38/68  brain]
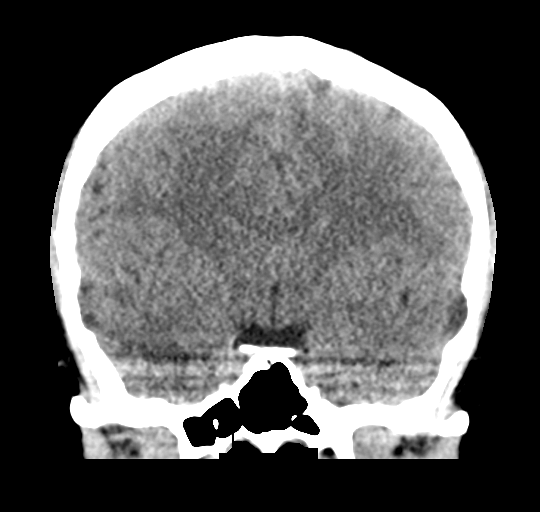

[Series 5: sagittal soft tissue · sagittal · 0.29mm/px · 3 of 57 slices shown]
[im 19/57  brain]
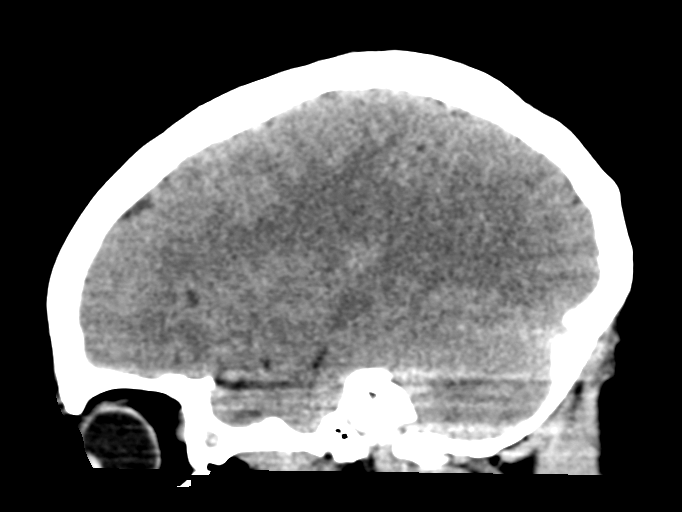
[im 29/57  brain]
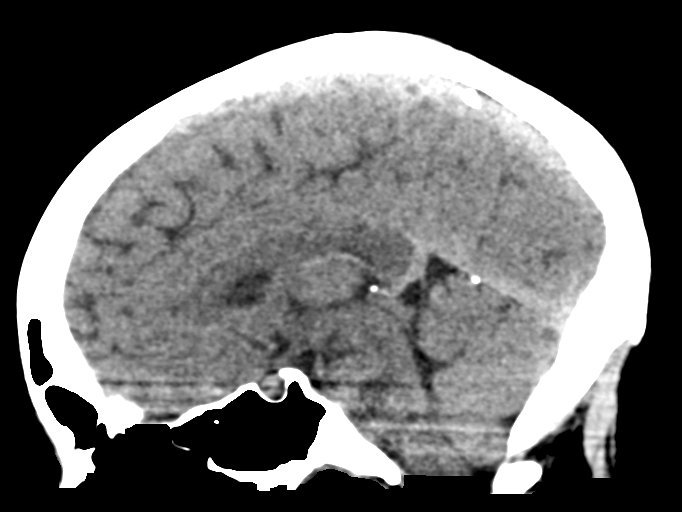
[im 38/57  brain]
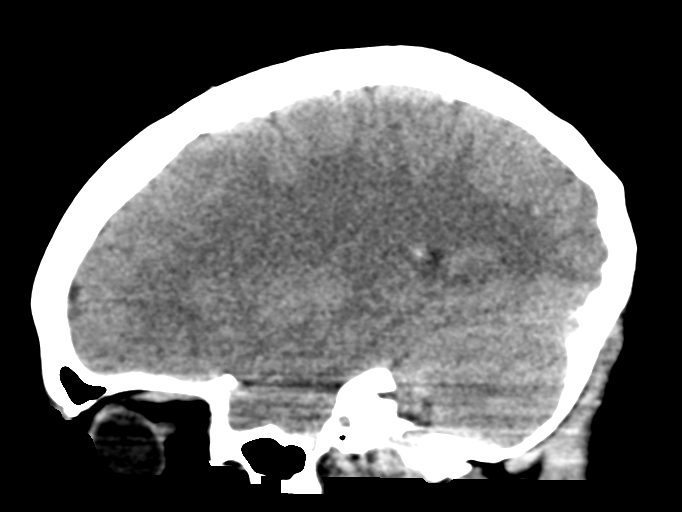

[15 of 47 positions shown; findings below may reference images not displayed]

FINDINGS: Brain: There is no evidence of acute infarct, intracranial
hemorrhage, mass, midline shift, or extra-axial fluid collection.
The ventricles and sulci are normal.

Vascular: No hyperdense vessel.

Skull: No fracture or focal osseous lesion.

Sinuses/Orbits: Visualized paranasal sinuses and mastoid air cells
are clear. A small osteoma projects into the left sphenoid sinus.
The orbits are unremarkable.

Other: None.
IMPRESSION: Negative head CT.

## 2019-02-23 ENCOUNTER — Ambulatory Visit: Payer: Medicaid Other | Admitting: Obstetrics and Gynecology

## 2019-03-06 ENCOUNTER — Ambulatory Visit: Payer: Medicaid Other | Admitting: Obstetrics and Gynecology

## 2019-04-01 ENCOUNTER — Ambulatory Visit (INDEPENDENT_AMBULATORY_CARE_PROVIDER_SITE_OTHER): Payer: Medicaid Other | Admitting: Obstetrics and Gynecology

## 2019-04-01 ENCOUNTER — Other Ambulatory Visit (HOSPITAL_COMMUNITY)
Admission: RE | Admit: 2019-04-01 | Discharge: 2019-04-01 | Disposition: A | Discharge status: Home / Self Care | Payer: Medicaid Other | Source: Ambulatory Visit | Attending: Obstetrics and Gynecology | Admitting: Obstetrics and Gynecology

## 2019-04-01 ENCOUNTER — Encounter: Payer: Self-pay | Admitting: Obstetrics and Gynecology

## 2019-04-01 ENCOUNTER — Other Ambulatory Visit: Payer: Self-pay

## 2019-04-01 VITALS — BP 100/60 | Ht 65.0 in | Wt 144.0 lb

## 2019-04-01 DIAGNOSIS — Z01419 Encounter for gynecological examination (general) (routine) without abnormal findings: Secondary | ICD-10-CM | POA: Insufficient documentation

## 2019-04-01 DIAGNOSIS — Z23 Encounter for immunization: Secondary | ICD-10-CM

## 2019-04-01 DIAGNOSIS — Z1331 Encounter for screening for depression: Secondary | ICD-10-CM

## 2019-04-01 DIAGNOSIS — Z1339 Encounter for screening examination for other mental health and behavioral disorders: Secondary | ICD-10-CM | POA: Diagnosis not present

## 2019-04-01 DIAGNOSIS — Z113 Encounter for screening for infections with a predominantly sexual mode of transmission: Secondary | ICD-10-CM | POA: Diagnosis present

## 2019-04-01 DIAGNOSIS — N921 Excessive and frequent menstruation with irregular cycle: Secondary | ICD-10-CM

## 2019-04-01 MED ORDER — ESTRADIOL 1 MG PO TABS: 1.0000 mg | ORAL_TABLET | Freq: Every day | ORAL | 3 refills | Status: DC

## 2019-04-01 NOTE — Progress Notes (Signed)
Gynecology Annual Exam  PCP: [facility]  Chief Complaint:  Chief Complaint  Patient presents with  . Gynecologic Exam    Irregular bleeding on nexplanon    History of Present Illness:  Ms. Laura Pitts is a 23 y.o. G1P1001 who LMP was Patient's last menstrual period was 03/30/2019 (exact date)., presents today for her annual examination.  Her menses irregular. She states that she might have a period, then the next month she might have 2 periods in one month.  She had the Nexplanon placed in 01/2017.  She tried a very low dose of estrogen in January for the same issue.  She has tried Mirena twice in the past.  She believes she has had one of the smaller IUDs, as well.  She would like to try estrogen again.   She is sexually active. She uses Nexplanon for contraception.  Last Pap: 1 year  Results were: no abnormalities /neg HPV DNA not done Hx of STDs: none  There is no FH of breast cancer. There is no FH of ovarian cancer. The patient does not do self-breast exams.  Tobacco use: active smoker.  She currently smokes 1/3-1/2 ppd.  She also vapes.   Alcohol use: social drinker Exercise: none  The patient wears seatbelts: yes.   The patient reports that domestic violence in her life is absent.   Past Medical History:  Diagnosis Date  . Anxiety   . Bipolar affective disorder (HCC)   . Depression   . Migraine     Past Surgical History:  Procedure Laterality Date  . INTRAUTERINE DEVICE (IUD) INSERTION  11/07/12, 01/19/2015   Skyla  . NO PAST SURGERIES    . tubes in ears      Prior to Admission medications   Medication Sig Start Date End Date Taking? Authorizing Provider  LATUDA 40 MG TABS tablet Take 40 mg by mouth daily. 02/06/19  Yes [provider]  traZODone (DESYREL) 150 MG tablet Take 150 mg by mouth at bedtime. 02/06/19  Yes [provider]  Vilazodone HCl (VIIBRYD) 40 MG TABS Take 40 mg by mouth daily.   Yes [provider]     Allergies  Allergen Reactions  . Latex   . Venlafaxine Other (See Comments)    Too tired Too tired    Obstetric History: G1P1001  Social History   Socioeconomic History  . Marital status: Single    Spouse name: Not on file  . Number of children: Not on file  . Years of education: Not on file  . Highest education level: Not on file  Occupational History  . Not on file  Social Needs  . Financial resource strain: Not on file  . Food insecurity    Worry: Not on file    Inability: Not on file  . Transportation needs    Medical: Not on file    Non-medical: Not on file  Tobacco Use  . Smoking status: Current Every Day Smoker    Packs/day: 0.50    Types: Cigarettes  . Smokeless tobacco: Never Used  Substance and Sexual Activity  . Alcohol use: Yes  . Drug use: Yes    Types: Marijuana  . Sexual activity: Yes    Birth control/protection: Implant  Lifestyle  . Physical activity    Days per week: Not on file    Minutes per session: Not on file  . Stress: Not on file  Relationships  . Social connections    Talks  on phone: Not on file    Gets together: Not on file    Attends religious service: Not on file    Active member of club or organization: Not on file    Attends meetings of clubs or organizations: Not on file    Relationship status: Not on file  . Intimate partner violence    Fear of current or ex partner: Not on file    Emotionally abused: Not on file    Physically abused: Not on file    Forced sexual activity: Not on file  Other Topics Concern  . Not on file  Social History Narrative  . Not on file    Family History  Problem Relation Age of Onset  . Migraines Mother   . Hypertension Father   . Brain cancer Maternal Grandmother   . Lung cancer Maternal Grandfather     Review of Systems  Constitutional: Negative.   HENT: Negative.   Eyes: Negative.   Respiratory: Negative.   Cardiovascular: Negative.   Gastrointestinal: Negative.    Genitourinary: Negative.   Musculoskeletal: Negative.   Skin: Negative.   Neurological: Negative.   Psychiatric/Behavioral: Negative.      Physical Exam BP 100/60   Ht 5\' 5"  (1.651 m)   Wt 144 lb (65.3 kg)   LMP 03/30/2019 (Exact Date)   BMI 23.96 kg/m    Physical Exam Constitutional:      General: She is not in acute distress.    Appearance: Normal appearance. She is well-developed.  Genitourinary:     Pelvic exam was performed with patient in the lithotomy position.     Vulva, urethra, bladder and uterus normal.     No inguinal adenopathy present in the right or left side.    No signs of injury in the vagina.     No vaginal discharge, erythema, tenderness or bleeding.     No cervical motion tenderness, discharge, lesion or polyp.     Uterus is mobile.     Uterus is not enlarged or tender.     No uterine mass detected.    Uterus is anteverted.     No right or left adnexal mass present.     Right adnexa not tender or full.     Left adnexa not tender or full.  HENT:     Head: Normocephalic and atraumatic.  Eyes:     General: No scleral icterus.    Conjunctiva/sclera: Conjunctivae normal.  Neck:     Musculoskeletal: Normal range of motion and neck supple.     Thyroid: No thyromegaly.  Cardiovascular:     Rate and Rhythm: Normal rate and regular rhythm.     Heart sounds: No murmur. No friction rub. No gallop.   Pulmonary:     Effort: Pulmonary effort is normal. No respiratory distress.     Breath sounds: Normal breath sounds. No wheezing or rales.  Chest:     Breasts:        Right: No inverted nipple, mass, nipple discharge, skin change or tenderness.        Left: No inverted nipple, mass, nipple discharge, skin change or tenderness.  Abdominal:     General: Bowel sounds are normal. There is no distension.     Palpations: Abdomen is soft. There is no mass.     Tenderness: There is no abdominal tenderness. There is no guarding or rebound.  Musculoskeletal: Normal  range of motion.        General: No swelling  or tenderness.  Lymphadenopathy:     Cervical: No cervical adenopathy.     Lower Body: No right inguinal adenopathy. No left inguinal adenopathy.  Neurological:     General: No focal deficit present.     Mental Status: She is alert and oriented to person, place, and time.     Cranial Nerves: No cranial nerve deficit.  Skin:    General: Skin is warm and dry.     Findings: No erythema or rash.  Psychiatric:        Mood and Affect: Mood normal.        Behavior: Behavior normal.        Judgment: Judgment normal.     Female chaperone present for pelvic and breast  portions of the physical exam  Results: AUDIT Questionnaire (screen for alcoholism): 1 PHQ-9: 8   Assessment: 23 y.o. 371P1001 female here for routine annual gynecologic examination  Plan: Problem List Items Addressed This Visit    None    Visit Diagnoses    Women's annual routine gynecological examination    -  Primary   Relevant Medications   estradiol (ESTRACE) 1 MG tablet   Other Relevant Orders   Cervicovaginal ancillary only   Screening for depression       Screening for alcoholism       Screen for STD (sexually transmitted disease)       Relevant Orders   Cervicovaginal ancillary only   Menorrhagia with irregular cycle       Relevant Medications   estradiol (ESTRACE) 1 MG tablet     Screening: -- Blood pressure screen normal -- Weight screening: normal -- Depression screening negative (PHQ-9) -- Nutrition: normal -- cholesterol screening: not due for screening -- osteoporosis screening: not due -- tobacco screening: using: discussed quitting using the 5 A's -- alcohol screening: AUDIT questionnaire indicates low-risk usage. -- family history of breast cancer screening: done. not at high risk. -- no evidence of domestic violence or intimate partner violence. -- STD screening: gonorrhea/chlamydia NAAT collected -- pap smear not collected per [facility]P  guidelines -- flu vaccine declines -- HPV vaccination series: has not received - will give first one today  Thomasene MohairStephen Airlie Blumenberg, MD 04/01/2019 11:07 AM

## 2019-04-01 NOTE — Addendum Note (Signed)
Addended by: Raechel Chute on: 04/01/2019 11:21 AM   Modules accepted: Orders

## 2019-04-03 LAB — CERVICOVAGINAL ANCILLARY ONLY
Chlamydia: NEGATIVE
Comment: NEGATIVE
Comment: NEGATIVE
Comment: NORMAL
Neisseria Gonorrhea: NEGATIVE
Trichomonas: NEGATIVE

## 2019-06-01 ENCOUNTER — Ambulatory Visit: Payer: Medicaid Other

## 2019-06-03 ENCOUNTER — Ambulatory Visit: Payer: Medicaid Other

## 2019-07-06 NOTE — Progress Notes (Signed)
   Chief Complaint  Patient presents with  . Nexplanon removal    not interested in new BC  . Follow-up    Gardasil #2     History of Present Illness:  Laura Pitts is a 24 y.o. that had a nexplanon placed approximately 2 1/2  years  ago. Since that time, she has done well with it. She would like it removed for conception. She is taking MVI, but not PNVs.  Neg pap 10/19. Neg STD testing 11/20.  BP 100/68   Ht 5\' 5"  (1.651 m)   Wt 140 lb (63.5 kg)   BMI 23.30 kg/m    Nexplanon removal Procedure note - The Nexplanon was noted in the patient's arm and the end was identified. The skin was cleansed with a Betadine solution. A small injection of subcutaneous lidocaine with epinephrine was given over the end of the implant. An incision was made at the end of the implant. The rod was noted in the incision and grasped with a hemostat. It was noted to be intact.  Steri-Strip was placed approximating the incision. Hemostasis was noted.  Assessment: Nexplanon removal  Pre-conception counseling--start PNVs. F/u prn NOB.   Plan:   She was told to remove the dressing in 12-24 hours, to keep the incision area dry for 24 hours and to remove the Steristrip in 2-3  days.  Notify 04-28-1992 if any signs of tenderness, redness, pain, or fevers develop.   Dal Blew B. Aunna Snooks, PA-C 07/07/2019 9:49 AM

## 2019-07-07 ENCOUNTER — Ambulatory Visit (INDEPENDENT_AMBULATORY_CARE_PROVIDER_SITE_OTHER): Payer: Medicaid Other | Admitting: Obstetrics and Gynecology

## 2019-07-07 ENCOUNTER — Other Ambulatory Visit: Payer: Self-pay

## 2019-07-07 ENCOUNTER — Encounter: Payer: Self-pay | Admitting: Obstetrics and Gynecology

## 2019-07-07 VITALS — BP 100/68 | Ht 65.0 in | Wt 140.0 lb

## 2019-07-07 DIAGNOSIS — Z3046 Encounter for surveillance of implantable subdermal contraceptive: Secondary | ICD-10-CM

## 2019-07-07 DIAGNOSIS — Z23 Encounter for immunization: Secondary | ICD-10-CM | POA: Diagnosis not present

## 2019-07-07 DIAGNOSIS — Z3169 Encounter for other general counseling and advice on procreation: Secondary | ICD-10-CM

## 2019-07-07 NOTE — Patient Instructions (Signed)
I value your feedback and entrusting us with your care. If you get a [facility] patient survey, I would appreciate you taking the time to let us know about your experience today. Thank you!  As of April 23, 2019, your lab results will be released to your MyChart immediately, before I even have a chance to see them. Please give me time to review them and contact you if there are any abnormalities. Thank you for your patience.   Remove the dressing in 24 hours,  keep the incision area dry for 24 hours and remove the Steristrip in 2-3  days.  Notify us if any signs of tenderness, redness, pain, or fevers develop.  

## 2019-07-07 NOTE — Addendum Note (Signed)
Addended by: Donnetta Hail on: 07/07/2019 09:59 AM   Modules accepted: Orders

## 2019-09-29 ENCOUNTER — Ambulatory Visit: Payer: Medicaid Other

## 2019-11-10 ENCOUNTER — Ambulatory Visit: Payer: Medicaid Other

## 2019-11-10 ENCOUNTER — Telehealth: Payer: Self-pay

## 2019-11-10 ENCOUNTER — Other Ambulatory Visit: Payer: Self-pay

## 2019-11-10 NOTE — Telephone Encounter (Signed)
Pt came in today for Gardasil which was NOT given d/t we were out. (Last on used after hours last night).  As far as pt's pregnancy sxs and negative home preg tests:  Pt's last period was light which was this month; May's period was heavier. Pt's last preg test was last Fri.  Adv pt to wait a week or two and repeat the test.  If negative and still has sxs to call to be seen.  Adv if she is preg we wouldn't give her the gardasil anyway.  Adv I could do a preg test today but ours are not stronger than otc preg tests.  Pt states she will wait.

## 2019-11-18 NOTE — Telephone Encounter (Signed)
SP called pt to schedule gardasil as we were out when she had appt.  Pt stated she wants to wait and see if she is preg or not before getting the gardasil.  Pt to get back with [place].

## 2020-02-11 ENCOUNTER — Emergency Department
Admission: EM | Admit: 2020-02-11 | Discharge: 2020-02-11 | Discharge status: Against Medical Advice | Payer: Medicaid Other

## 2020-02-11 NOTE — ED Notes (Signed)
Pt notified front dest staff that she was leaving

## 2020-02-11 NOTE — ED Notes (Signed)
Pt up to stat desk stating stating that she wants to leave because she "don't have time to wait 3.5 hours" pt says that she will follow up with her OB today. First Rn notified

## 2020-03-16 DIAGNOSIS — Z3483 Encounter for supervision of other normal pregnancy, third trimester: Secondary | ICD-10-CM

## 2020-03-17 DIAGNOSIS — O09891 Supervision of other high risk pregnancies, first trimester: Secondary | ICD-10-CM | POA: Insufficient documentation

## 2020-05-14 NOTE — L&D Delivery Note (Addendum)
Delivery Note  Laura Pitts is a G2P1001 at [redacted]w[redacted]d with an LMP of 01/04/2020, consistent with [place] at [redacted]w[redacted]d.   First Stage: Labor onset: 0130 Augmentation: None Analgesia /Anesthesia intrapartum: None SROM at 0400 with thick meconium   Second Stage: Complete dilation at 0447 Onset of pushing at 0447 FHR second stage: Unable to obtain FHT d/t precipitous delivery after arrival to L&D  Laura Pitts presented to L&D with strong regular contractions and an urge to push.  She was quickly assisted to an LDR for assessment.  Fetal presenting part was visible with contractions. The room was quickly prepared for an immanent delivery.  Laura Pitts pushed quickly over 1 contraction for a spontaneous birth.  Delivery of a viable baby girl on 09/28/2020 at 0448 by CNM Delivery of fetal head in OA position with a quick restitution to ROT, followed by anterior then posterior shoulders in one push. Infant delivered through loose nuchal and body cord. Baby placed on mom's chest, and attended to by baby RN. Cord double clamped after cessation of pulsation, cut by father of baby. Infant to warmer for further assessment of SCN team d/t meconium and concern for adequate oxygenation.   Cord blood sample collected: N/A - A Pos   Third Stage: Oxytocin IM injection given after delivery of infant for hemorrhage prophylaxis  Placenta delivered intact with 3 VC @ 0459 Placenta disposition: discarded  Uterine tone frim / bleeding moderate   1st vaginal laceration identified, hemostatic  Anesthesia for repair: N/A Repair: None - laceration hemostatic  Est. Blood Loss (mL): 250 ml  Complications: Precipitous delivery   Mom to postpartum.  Baby to Couplet care / Skin to Skin.  Newborn: Information for the patient's newborn:  Laura, Pitts [[id number]]  Live born female "Laura Pitts" Birth Weight: 7 lb 10.1 oz (3460 g) APGAR: 8, 8  Newborn Delivery   Birth date/time: 09/28/2020 04:48:00 Delivery type: Vaginal,  Spontaneous      Feeding planned: Breast   ---------- Margaretmary Eddy, CNM Certified Nurse Midwife [facility]

## 2020-08-24 ENCOUNTER — Encounter: Payer: Self-pay | Admitting: Neurology

## 2020-09-28 ENCOUNTER — Inpatient Hospital Stay
Admission: EM | Admit: 2020-09-28 | Discharge: 2020-09-29 | DRG: 806 | Disposition: A | Discharge status: Home / Self Care | Payer: Medicaid Other | Attending: Obstetrics and Gynecology | Admitting: Obstetrics and Gynecology

## 2020-09-28 ENCOUNTER — Other Ambulatory Visit: Payer: Self-pay

## 2020-09-28 DIAGNOSIS — Z3490 Encounter for supervision of normal pregnancy, unspecified, unspecified trimester: Secondary | ICD-10-CM | POA: Insufficient documentation

## 2020-09-28 DIAGNOSIS — Z20822 Contact with and (suspected) exposure to covid-19: Secondary | ICD-10-CM | POA: Diagnosis present

## 2020-09-28 DIAGNOSIS — O99334 Smoking (tobacco) complicating childbirth: Secondary | ICD-10-CM | POA: Diagnosis present

## 2020-09-28 DIAGNOSIS — F1290 Cannabis use, unspecified, uncomplicated: Secondary | ICD-10-CM | POA: Diagnosis present

## 2020-09-28 DIAGNOSIS — O26893 Other specified pregnancy related conditions, third trimester: Secondary | ICD-10-CM | POA: Diagnosis present

## 2020-09-28 DIAGNOSIS — Z3A38 38 weeks gestation of pregnancy: Secondary | ICD-10-CM

## 2020-09-28 DIAGNOSIS — O99324 Drug use complicating childbirth: Secondary | ICD-10-CM | POA: Diagnosis present

## 2020-09-28 DIAGNOSIS — F17210 Nicotine dependence, cigarettes, uncomplicated: Secondary | ICD-10-CM | POA: Diagnosis present

## 2020-09-28 DIAGNOSIS — Z3483 Encounter for supervision of other normal pregnancy, third trimester: Secondary | ICD-10-CM

## 2020-09-28 LAB — CBC
HCT: 36.8 % (ref 36.0–46.0)
Hemoglobin: 12.2 g/dL (ref 12.0–15.0)
MCH: 26.7 pg (ref 26.0–34.0)
MCHC: 33.2 g/dL (ref 30.0–36.0)
MCV: 80.5 fL (ref 80.0–100.0)
Platelets: 242 10*3/uL (ref 150–400)
RBC: 4.57 MIL/uL (ref 3.87–5.11)
RDW: 13.6 % (ref 11.5–15.5)
WBC: 18.4 10*3/uL — ABNORMAL HIGH (ref 4.0–10.5)
nRBC: 0 % (ref 0.0–0.2)

## 2020-09-28 LAB — RESP PANEL BY RT-PCR (FLU A&B, COVID) ARPGX2
Influenza A by PCR: NEGATIVE
Influenza B by PCR: NEGATIVE
SARS Coronavirus 2 by RT PCR: NEGATIVE

## 2020-09-28 LAB — TYPE AND SCREEN
ABO/RH(D): A POS
Antibody Screen: NEGATIVE

## 2020-09-28 LAB — RPR: RPR Ser Ql: NONREACTIVE

## 2020-09-28 LAB — URINE DRUG SCREEN, QUALITATIVE (ARMC ONLY)
Amphetamines, Ur Screen: NOT DETECTED
Barbiturates, Ur Screen: NOT DETECTED
Benzodiazepine, Ur Scrn: NOT DETECTED
Cannabinoid 50 Ng, Ur ~~LOC~~: POSITIVE — AB
Cocaine Metabolite,Ur ~~LOC~~: NOT DETECTED
MDMA (Ecstasy)Ur Screen: NOT DETECTED
Methadone Scn, Ur: NOT DETECTED
Opiate, Ur Screen: NOT DETECTED
Phencyclidine (PCP) Ur S: NOT DETECTED
Tricyclic, Ur Screen: NOT DETECTED

## 2020-09-28 LAB — ABO/RH: ABO/RH(D): A POS

## 2020-09-28 MED ORDER — DIBUCAINE (PERIANAL) 1 % EX OINT: 1.0000 "application " | TOPICAL_OINTMENT | CUTANEOUS | Status: DC | PRN

## 2020-09-28 MED ORDER — OXYCODONE-ACETAMINOPHEN 5-325 MG PO TABS: 1.0000 | ORAL_TABLET | ORAL | Status: DC | PRN

## 2020-09-28 MED ORDER — PENICILLIN G POT IN DEXTROSE 60000 UNIT/ML IV SOLN: 3.0000 10*6.[IU] | INTRAVENOUS | Status: DC

## 2020-09-28 MED ORDER — SODIUM CHLORIDE 0.9 % IV SOLN: 5.0000 10*6.[IU] | Freq: Once | INTRAVENOUS | Status: DC

## 2020-09-28 MED ORDER — OXYCODONE-ACETAMINOPHEN 5-325 MG PO TABS: 2.0000 | ORAL_TABLET | ORAL | Status: DC | PRN

## 2020-09-28 MED ORDER — ONDANSETRON HCL 4 MG/2ML IJ SOLN: 4.0000 mg | Freq: Four times a day (QID) | INTRAMUSCULAR | Status: DC | PRN

## 2020-09-28 MED ORDER — MISOPROSTOL 25 MCG QUARTER TABLET: 25.0000 ug | ORAL_TABLET | ORAL | Status: DC | PRN

## 2020-09-28 MED ORDER — OXYTOCIN BOLUS FROM INFUSION: 333.0000 mL | Freq: Once | INTRAVENOUS | Status: DC

## 2020-09-28 MED ORDER — ACETAMINOPHEN 500 MG PO TABS
1000.0000 mg | ORAL_TABLET | Freq: Four times a day (QID) | ORAL | Status: DC | PRN
  Administered 2020-09-28: 1000 mg via ORAL
  Filled 2020-09-28: qty 2

## 2020-09-28 MED ORDER — COCONUT OIL OIL
1.0000 "application " | TOPICAL_OIL | Status: DC | PRN
  Administered 2020-09-28: 1 via TOPICAL
  Filled 2020-09-28: qty 120

## 2020-09-28 MED ORDER — ONDANSETRON HCL 4 MG PO TABS
4.0000 mg | ORAL_TABLET | ORAL | Status: DC | PRN
  Filled 2020-09-28: qty 1

## 2020-09-28 MED ORDER — MISOPROSTOL 25 MCG QUARTER TABLET: 25.0000 ug | ORAL_TABLET | Freq: Once | ORAL | Status: DC

## 2020-09-28 MED ORDER — WITCH HAZEL-GLYCERIN EX PADS: 1.0000 "application " | MEDICATED_PAD | CUTANEOUS | Status: DC

## 2020-09-28 MED ORDER — LACTATED RINGERS IV SOLN: 500.0000 mL | INTRAVENOUS | Status: DC | PRN

## 2020-09-28 MED ORDER — ACETAMINOPHEN 325 MG PO TABS: 650.0000 mg | ORAL_TABLET | ORAL | Status: DC | PRN

## 2020-09-28 MED ORDER — BUTORPHANOL TARTRATE 1 MG/ML IJ SOLN: 1.0000 mg | INTRAMUSCULAR | Status: DC | PRN

## 2020-09-28 MED ORDER — LIDOCAINE HCL (PF) 1 % IJ SOLN
INTRAMUSCULAR | Status: AC
  Filled 2020-09-28: qty 30

## 2020-09-28 MED ORDER — OXYTOCIN-SODIUM CHLORIDE 30-0.9 UT/500ML-% IV SOLN: 2.5000 [IU]/h | INTRAVENOUS | Status: DC

## 2020-09-28 MED ORDER — LACTATED RINGERS IV SOLN: INTRAVENOUS | Status: DC

## 2020-09-28 MED ORDER — IBUPROFEN 600 MG PO TABS
600.0000 mg | ORAL_TABLET | Freq: Four times a day (QID) | ORAL | Status: DC
  Administered 2020-09-28 – 2020-09-29 (×5): 600 mg via ORAL
  Filled 2020-09-28 (×5): qty 1

## 2020-09-28 MED ORDER — TERBUTALINE SULFATE 1 MG/ML IJ SOLN: 0.2500 mg | Freq: Once | INTRAMUSCULAR | Status: DC | PRN

## 2020-09-28 MED ORDER — MISOPROSTOL 200 MCG PO TABS
ORAL_TABLET | ORAL | Status: AC
  Filled 2020-09-28: qty 4

## 2020-09-28 MED ORDER — DOCUSATE SODIUM 100 MG PO CAPS
100.0000 mg | ORAL_CAPSULE | Freq: Two times a day (BID) | ORAL | Status: DC
  Administered 2020-09-28 – 2020-09-29 (×2): 100 mg via ORAL
  Filled 2020-09-28 (×2): qty 1

## 2020-09-28 MED ORDER — PRENATAL MULTIVITAMIN CH
1.0000 | ORAL_TABLET | Freq: Every day | ORAL | Status: DC
  Administered 2020-09-28 – 2020-09-29 (×2): 1 via ORAL
  Filled 2020-09-28 (×2): qty 1

## 2020-09-28 MED ORDER — LIDOCAINE HCL (PF) 1 % IJ SOLN: 30.0000 mL | INTRAMUSCULAR | Status: DC | PRN

## 2020-09-28 MED ORDER — SIMETHICONE 80 MG PO CHEW
80.0000 mg | CHEWABLE_TABLET | ORAL | Status: DC | PRN
  Administered 2020-09-29: 80 mg via ORAL
  Filled 2020-09-28: qty 1

## 2020-09-28 MED ORDER — ONDANSETRON HCL 4 MG/2ML IJ SOLN: 4.0000 mg | INTRAMUSCULAR | Status: DC | PRN

## 2020-09-28 MED ORDER — OXYTOCIN 10 UNIT/ML IJ SOLN
INTRAMUSCULAR | Status: AC
  Administered 2020-09-28: 10 [IU]
  Filled 2020-09-28: qty 2

## 2020-09-28 MED ORDER — BENZOCAINE-MENTHOL 20-0.5 % EX AERO: 1.0000 "application " | INHALATION_SPRAY | CUTANEOUS | Status: DC | PRN

## 2020-09-28 MED ORDER — DIPHENHYDRAMINE HCL 25 MG PO CAPS: 25.0000 mg | ORAL_CAPSULE | Freq: Four times a day (QID) | ORAL | Status: DC | PRN

## 2020-09-28 MED ORDER — AMMONIA AROMATIC IN INHA
RESPIRATORY_TRACT | Status: AC
  Filled 2020-09-28: qty 10

## 2020-09-28 NOTE — Progress Notes (Addendum)
Pt arrived to Labor and Delivery stating that she feels the need to "poop." Upon arrival to LDR6, RN visualized baby's head from vagina. RN pushed the "staff assist" button and multiple members of the L&D and Nursery staff arrived to assist with precipitous delivery.

## 2020-09-28 NOTE — Lactation Note (Signed)
This note was copied from a baby's chart. Lactation Consultation Note  Patient Name: Laura Pitts Today's Date: 09/28/2020 Reason for consult: Initial assessment (Mother was asleep, Spoke with [name], encouraged to call) Age:26 hours Lactation to the room. Mother is asleep. Spoke with [name], encouraged a call for assistance. LC # on board.    Feeding Mother's Current Feeding Choice: Breast Milk  Consult Status Consult Status: Follow-up Date: 09/29/20 Follow-up type: Call as needed    Samanda Buske D Yug Loria 09/28/2020, 3:04 PM

## 2020-09-28 NOTE — H&P (Signed)
This note was written after delivery d/t precipitous birth following arrival to L&D.   OB History & Physical   History of Present Illness:   Chief Complaint: urge to push, painful contractions   HPI:  JERICA CREEGAN is a 25 y.o. G77P1001 female at [redacted]w[redacted]d dated by LMP, c/w [place] at [redacted]w[redacted]d.  She presents to L&D for advanced labor and urge to push.  She reports that her contractions started around 0130 and SROM of thick meconium aroun 0400.  After her water broke she had a strong urge to push.  Tynisa and her spouse were able to make it to [facility] where they were escorted immediately to L&D.  Fetal presenting part visualized when she had a contraction.  LDR prepared for immanent delivery.   Reports active fetal movement  Contractions: every 2 to 3 minutes starting at 0130 LOF/SROM: Thick meconium at 0400 Vaginal bleeding: bloody show  Pregnancy Issues: 1. THC use in pregnancy  2. Elevated 1hr GTT - 3hr WNL  3. Maternal mental health diagnosis - Anxiety and depression   Patient Active Problem List   Diagnosis Date Noted  . Normal labor 09/28/2020  . Medication exposure during first trimester of pregnancy 03/17/2020  . Encounter for supervision of other normal pregnancy, third trimester 03/16/2020  . B12 deficiency 09/24/2016  . MDD (major depressive disorder), recurrent episode, severe (HCC) 09/21/2016  . PTSD (post-traumatic stress disorder) 09/21/2016  . Tobacco use disorder 09/21/2016  . 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 Chronic migraine 02/24/2014     Maternal Medical History:   Past Medical History:  Diagnosis Date  . Anxiety   . Bipolar affective disorder (HCC)   . Depression   . Migraine     Past Surgical History:  Procedure Laterality Date  . INTRAUTERINE DEVICE (IUD) INSERTION  11/07/12, 01/19/2015   Skyla  . NO PAST SURGERIES    . tubes in ears      Allergies  Allergen Reactions  . Latex   . Venlafaxine Other (See Comments)    Too tired Too tired    Prior  to Admission medications   Not on File     Prenatal care site:  [facility]  Social History: She  reports that she has been smoking cigarettes. She has been smoking about 0.50 packs per day. She has never used smokeless tobacco. She reports current alcohol use. She reports current drug use. Drug: Marijuana.  Family History: family history includes Brain cancer in her maternal grandmother; Hypertension in her father; Lung cancer in her maternal grandfather; Migraines in her mother.   Review of Systems: A full review of systems was performed and negative except as noted in the HPI.     Physical Exam:  Vital Signs: LMP 01/04/2020 (Exact Date)  Physical Exam  General: no acute distress.  HEENT: normocephalic, atraumatic Heart: regular rate & rhythm.  No murmurs/rubs/gallops Lungs: clear to auscultation bilaterally, normal respiratory effort Abdomen: soft, gravid, non-tender;  EFW: 7lbs  Pelvic:   External: Normal external female genitalia  Cervix: 10/100/+3   Extremities: non-tender, symmetric, No edema bilaterally.  DTRs: 2+/2+  Neurologic: Alert & oriented x 3.    No results found for this or any previous visit (from the past 24 hour(s)).  Pertinent Results:  Prenatal Labs: Blood type/Rh A pos   Antibody screen neg  Rubella Immune  Varicella Immune  RPR NR  HBsAg Neg  HIV NR  GC neg  Chlamydia neg  Genetic screening cfDNA neg, AFP neg  1 hour GTT 141  3 hour GTT 87, 182, 123, 94  GBS Negative    FHT:  Unable to obtain FHT prior to delivery  Category/reactivity:  Indeterminant  UC:   regular, every 2 minutes   Cephalic by visualization of presenting part   No results found.  Assessment:  AVE SCHARNHORST is a 25 y.o. G65P1001 female at [redacted]w[redacted]d with advanced labor.   Plan:  1. Admit to Labor & Delivery; consents reviewed and obtained - Covid admission screen completed after delivery  - LDR prepared for precipitous delivery   2. Fetal Well being   - Fetal Tracing: Unable to obtain fetal tracing d/t precipitous delivery after arrival to L&D  - Group B Streptococcus ppx not indicated: GBS net - Presentation: cephalic confirmed by visualization of presenting part   3. Routine OB: - Prenatal labs reviewed, as above - Rh pos - CBC, T&S, RPR on admit - Reg diet  4. Monitoring of labor  - Contractions monitored with external toco - Pelvis adequate for trial of labor  - Plan for expectant management  - Maternal pain control as desired; planning unmedicated labor support options  - Anticipate vaginal delivery  5. Post Partum Planning: - Infant feeding: Breast - Contraception: Nexplanon  - Tdap vaccine: given 07/14/2020 - Flu vaccine: declined   Gustavo Lah, CNM 09/28/20 5:50 AM  Margaretmary Eddy, CNM Certified Nurse Midwife [facility]

## 2020-09-28 NOTE — Discharge Summary (Signed)
Obstetrical Discharge Summary  Patient Name: Laura Pitts DOB: 04/18/1996 MRN: [id number]  Date of Admission: 09/28/2020 Date of Delivery: 09/28/2020 Delivered by: Margaretmary Eddy, CNM  Date of Discharge: 09/29/2020  Primary OB: [facility] [id number] last menstrual period was 01/04/2020 (exact date). EDC Estimated Date of Delivery: 10/10/20 Gestational Age at Delivery: [redacted]w[redacted]d   Antepartum complications:  1. THC use in pregnancy  2. Elevated 1hr GTT - 3hr WNL  3. Maternal mental health diagnosis - Anxiety and depression    Admitting Diagnosis: Normal labor  Secondary Diagnosis: Patient Active Problem List   Diagnosis Date Noted  . NSVD (normal spontaneous vaginal delivery) 09/29/2020  . Normal labor 09/28/2020  . Medication exposure during first trimester of pregnancy 03/17/2020  . Encounter for supervision of other normal pregnancy, third trimester 03/16/2020  . B12 deficiency 09/24/2016  . MDD (major depressive disorder), recurrent episode, severe (HCC) 09/21/2016  . PTSD (post-traumatic stress disorder) 09/21/2016  . Tobacco use disorder 09/21/2016  . 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 Chronic migraine 02/24/2014    Augmentation: N/A Complications: None Intrapartum complications/course: Laura Pitts presented to L&D with strong regular contractions and an urge to push.  She was quickly assisted to an LDR for assessment.  Fetal presenting part was visible with contractions. The room was quickly prepared for an immanent delivery.  Laura Pitts pushed quickly over 1 contraction for a spontaneous birth.  Delivery Type: spontaneous vaginal delivery Anesthesia: None Placenta: spontaneous Laceration: 1st degree vaginal, hemostatic, no repair  Episiotomy: none Newborn Data: Live born female "Victory Dakin"  Birth Weight: 7 lb 10.1 oz (3460 g) APGAR: 8, 8  Newborn Delivery   Birth date/time: 09/28/2020 04:48:00 Delivery type: Vaginal, Spontaneous      Postpartum Procedures:  none Edinburgh:  Edinburgh Postnatal Depression Scale Screening Tool 09/28/2020 09/28/2020  I have been able to laugh and see the funny side of things. 0 (No Data)  I have looked forward with enjoyment to things. 0 -  I have blamed myself unnecessarily when things went wrong. 1 -  I have been anxious or worried for no good reason. 1 -  I have felt scared or panicky for no good reason. 2 -  Things have been getting on top of me. 1 -  I have been so unhappy that I have had difficulty sleeping. 1 -  I have felt sad or miserable. 1 -  I have been so unhappy that I have been crying. 1 -  The thought of harming myself has occurred to me. 0 -  Edinburgh Postnatal Depression Scale Total 8 -     Post partum course:  Patient had an uncomplicated postpartum course.  By time of discharge on PPD#1, her pain was controlled on oral pain medications; she had appropriate lochia and was ambulating, voiding without difficulty and tolerating regular diet.  She was deemed stable for discharge to home.      Discharge Physical Exam:  BP 103/69 (BP Location: Right Arm)   Pulse 80   Temp 98.1 F (36.7 C) (Oral)   Resp 18   LMP 01/04/2020 (Exact Date)   SpO2 95%   Breastfeeding Unknown   General: NAD CV: RRR Pulm: CTABL, nl effort ABD: s/nd/nt, fundus firm and below the umbilicus Lochia: moderate Perineum:minimal edema/repair well approximated DVT Evaluation: LE non-ttp, no evidence of DVT on exam.  Hemoglobin  Date Value Ref Range Status  09/29/2020 12.2 12.0 - 15.0 g/dL Final   HGB  Date Value Ref Range Status  03/22/2013 13.8  12.0 - 16.0 g/dL Final   HCT  Date Value Ref Range Status  09/29/2020 37.6 36.0 - 46.0 % Final  03/22/2013 40.0 35.0 - 47.0 % Final     Disposition: stable, discharge to home. Baby Feeding: breastmilk Baby Disposition: home with mom  Rh Immune globulin given: Rh pos  Rubella vaccine given: Immune Varivax vaccine given: Immune  Flu vaccine given in AP or PP  setting: declined  Tdap vaccine given in AP or PP setting: given 07/14/2020  Contraception: Nexplanon   Prenatal Labs:  Blood type/Rh A pos   Antibody screen neg  Rubella Immune  Varicella Immune  RPR NR  HBsAg Neg  HIV NR  GC neg  Chlamydia neg  Genetic screening cfDNA neg, AFP neg   1 hour GTT 141  3 hour GTT 87, 182, 123, 94  GBS Negative     Plan:  Laura Pitts was discharged to home in good condition. Follow-up appointment with delivering provider in 2 weeks for mood check.  Discharge Medications: Allergies as of 09/29/2020      Reactions   Latex    Venlafaxine Other (See Comments)   Too tired Too tired      Medication List    You have not been prescribed any medications.      Follow-up Information    Gustavo Lah, CNM. Schedule an appointment as soon as possible for a visit in 6 week(s).   Specialty: Certified Nurse Midwife Why: postpartum visit Contact information: [address] [place] [address] [phone]        [facility]. Schedule an appointment as soon as possible for a visit in 2 week(s).   Why: postpartum mood check Contact information: [address] [place] [address] [phone]              Signed: Cyril Mourning  09/29/2020 8:36 AM

## 2020-09-29 LAB — CBC
HCT: 37.6 % (ref 36.0–46.0)
Hemoglobin: 12.2 g/dL (ref 12.0–15.0)
MCH: 26.8 pg (ref 26.0–34.0)
MCHC: 32.4 g/dL (ref 30.0–36.0)
MCV: 82.5 fL (ref 80.0–100.0)
Platelets: 236 10*3/uL (ref 150–400)
RBC: 4.56 MIL/uL (ref 3.87–5.11)
RDW: 13.8 % (ref 11.5–15.5)
WBC: 12.7 10*3/uL — ABNORMAL HIGH (ref 4.0–10.5)
nRBC: 0 % (ref 0.0–0.2)

## 2020-09-29 NOTE — TOC Initial Note (Addendum)
Transition of Care [facility]) - Initial/Assessment Note    Patient Details  Name: Laura Pitts MRN: [id number] Date of Birth: 11-07-1995  [facility] CM/SW Contact:    [place] Cellar, RN Phone Number: 09/29/2020, 10:33 AM  Clinical Narrative:                 Spoke to patient at bedside with boyfriend present. Reports no needs or concerns. Patient is a resident of [address] and works full time for [employer]. Infant will remain with maternal grandfather while parents work. Has 73 year old daughter at home. Reports BF for 7 months with other child without difficulty and plans to continue to same with this infant. Planning to set up [facility] services through [place]-reviewed [facility] health department process. Discussed ppd and s/s with patient and partner. Patient reports previous ppd after first pregnancy. Patient has selected pediatrician and will have appointment by discharge. Discussed with patient and partner positive drug screening for her and infant and report being made to CPS. Patient verbalizes understanding and has no other needs or concerns. Discussed importance of proper handwashing and avoiding sick contacts.   Referral sent for patient to set up [facility]  LVMM for [facility] DSS to file CPS report.         Patient Goals and CMS Choice        Expected Discharge Plan and Services           Expected Discharge Date: 09/29/20                                    Prior Living Arrangements/Services                       Activities of Daily Living Home Assistive Devices/Equipment: None ADL Screening (condition at time of admission) Patient's cognitive ability adequate to safely complete daily activities?: Yes Is the patient deaf or have difficulty hearing?: No Does the patient have difficulty seeing, even when wearing glasses/contacts?: No Does the patient have difficulty concentrating, remembering, or making decisions?:  No Patient able to express need for assistance with ADLs?: Yes Does the patient have difficulty dressing or bathing?: No Independently performs ADLs?: Yes (appropriate for developmental age) Does the patient have difficulty walking or climbing stairs?: No Weakness of Legs: None Weakness of Arms/Hands: None  Permission Sought/Granted                  Emotional Assessment              Admission diagnosis:  Encounter for elective induction of labor [Z34.90] Patient Active Problem List   Diagnosis Date Noted  . NSVD (normal spontaneous vaginal delivery) 09/29/2020  . Normal labor 09/28/2020  . Medication exposure during first trimester of pregnancy 03/17/2020  . Encounter for supervision of other normal pregnancy, third trimester 03/16/2020  . B12 deficiency 09/24/2016  . MDD (major depressive disorder), recurrent episode, severe (HCC) 09/21/2016  . PTSD (post-traumatic stress disorder) 09/21/2016  . Tobacco use disorder 09/21/2016  . 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 Chronic migraine 02/24/2014   PCP:  [facility]:   [facility] [phone] [place], [place] - [address] AT [facility] [address] [place] [address] [place] [address] Phone: [phone] Fax: [phone]     Social Determinants of Health (SDOH) Interventions  Readmission Risk Interventions No flowsheet data found.

## 2020-09-29 NOTE — Discharge Instructions (Signed)
Postpartum Baby Blues The postpartum period begins right after the birth of a baby. During this time, there is often joy and excitement. It is also a time of many changes in the life of the parents. A mother may feel happy one minute and sad or stressed the next. These feelings of sadness, called the baby blues, usually happen in the period right after the baby is born and go away within a week or two. What are the causes? The exact cause of this condition is not known. Changes in hormone levels after childbirth are believed to trigger some of the symptoms. Other factors that can play a role in these mood changes include: Lack of sleep. Stressful life events, such as financial problems, caring for a loved one, or death of a loved one. Genetics. What are the signs or symptoms? Symptoms of this condition include: Changes in mood, such as going from extreme happiness to sadness. A decrease in concentration. Difficulty sleeping. Crying spells and tearfulness. Loss of appetite. Irritability. Anxiety. If these symptoms last for more than 2 weeks or become more severe, you may have postpartum depression. How is this diagnosed? This condition is diagnosed based on an evaluation of your symptoms. Your health care provider may use a screening tool that includes a list of questions to help identify a person with the baby blues or postpartum depression. How is this treated? The baby blues usually go away on their own in 1-2 weeks. Social support is often what is needed. You will be encouraged to get adequate sleep and rest. Follow these instructions at home: Lifestyle Get as much rest as you can. Take a nap when the baby sleeps. Exercise regularly as told by your health care provider. Some women find yoga and walking to be helpful. Eat a balanced and nourishing diet. This includes plenty of fruits and vegetables, whole grains, and lean proteins. Do little things that you enjoy. Take a bubble bath,  read your favorite magazine, or listen to your favorite music. Avoid alcohol. Ask for help with household chores, cooking, grocery shopping, or running errands. Do not try to do everything yourself. Consider hiring a postpartum doula to help. This is a professional who specializes in providing support to new mothers. Try not to make any major life changes during pregnancy or right after giving birth. This can add stress.      General instructions Talk to people close to you about how you are feeling. Get support from your partner, family members, friends, or other new moms. You may want to join a support group. Find ways to manage stress. This may include: Writing your thoughts and feelings in a journal. Spending time outside. Spending time with people who make you laugh. Try to stay positive in how you think. Think about the things you are grateful for. Take over-the-counter and prescription medicines only as told by your health care provider. Let your health care provider know if you have any concerns. Keep all postpartum visits. This is important. Contact a health care provider if: Your baby blues do not go away after 2 weeks. Get help right away if: You have thoughts of taking your own life (suicidal thoughts), or of harming your baby or someone else. You see or hear things that are not there (hallucinations). If you ever feel like you may hurt yourself or others, or have thoughts about taking your own life, get help right away. Go to your nearest emergency department or: Call your local emergency services (  911 in the [place] Call a suicide crisis helpline, such as the National Suicide Prevention Lifeline, at 952-744-0136. This is open 24 hours a day in the [place] Text the Crisis Text Line at 365-083-2347 (in the [place] Summary After giving birth, you may feel happy one minute and sad or stressed the next. Feelings of sadness that happen right after the baby is born and go away after a week or  two are called the baby blues. You can manage the baby blues by getting enough rest, eating a healthy diet, exercising, spending time with supportive people, and finding ways to manage stress. If feelings of sadness and stress last longer than 2 weeks or get in the way of caring for your baby, talk with your health care provider. This may mean you have postpartum depression. This information is not intended to replace advice given to you by your health care provider. Make sure you discuss any questions you have with your health care provider. Document Revised: 10/23/2019 Document Reviewed: 10/23/2019 Elsevier Patient Education  2021 Else[employer] Inc. Breastfeeding and Cracked or Sore Nipples It is normal to have some tenderness in your nipples when you start to breastfeed your new baby, even if you have breastfed before. Your nipples can also become cracked or sore. This may happen if your baby or the baby's mouth is not in the right position when breastfeeding. There are things you can do to help avoid these problems. How does this affect me? Cracked or sore nipples can be painful. If breastfeeding is too painful or your baby has a poor latch, you may have problems completely emptying your breasts during a feeding. This may lead to problems such as: Your breasts being too filled with milk (engorgement). Infection. A decrease in your milk supply How does this affect my baby? If a poor latch is the cause of cracked or sore nipples, your baby may also have problems getting enough breast milk during a feeding. Follow these instructions at home: Breastfeeding tips Make sure your baby's mouth attaches to your nipple (latches) properly to breastfeed. Make sure your baby is in the right position when breastfeeding. Try different positions to find one that works. Have your baby feed from the less sore breast first. Break the latch between your baby's mouth and your nipple before removing him or her from  your breast. To do this: Put your little finger in between your nipple and your baby's gums.   If you use a breast pump: Make sure the part of the pump that goes over your nipple fits properly. Start by setting the pump to a low setting. Increase the pump strength over time as needed. Too high of a setting may cause damage to your nipple. Breast care To help your breasts and nipples stay healthy: Avoid the use of soap on your nipples. Wear a supportive bra. Avoid wearing underwire bras or tight bras. Air-dry your nipples for 3-4 minutes after each feeding. Use only cotton bra pads to soak up any breast milk that leaks. Be sure to change the pads if they become soaked with milk. Put some lanolin on your nipples after breastfeeding. Pure lanolin does not need to be washed off your nipple before you feed your baby again. Pure lanolin is not harmful to your baby. Rub some breast milk into your nipples: Use your hand to squeeze out a few drops of breast milk. Gently massage the milk into your nipples. Let your nipples air-dry. Contact a doctor if: You  have nipple pain. You have soreness or cracking that lasts more than 1 week. Summary It is normal to have some tenderness in your nipples when you start to breastfeed your new baby. But you should contact your doctor if you have nipple pain. Your nipples can become cracked or sore if your baby's mouth does not attach to your nipple properly when breastfeeding. Rub lanolin or breast milk onto your nipples to keep them from getting cracked or sore. Avoid washing your nipples with soap. This information is not intended to replace advice given to you by your health care provider. Make sure you discuss any questions you have with your health care provider. Document Revised: 10/28/2019 Document Reviewed: 10/28/2019 Elsevier Patient Education  2021 Else[employer] Inc. Breastfeeding and Breast Care Breastfeeding can be challenging, especially during the first  few weeks after childbirth. It is normal to have some problems when you start to breastfeed your new baby, even if you have breastfed before. There are things that you can do to take care of yourself and help prevent common breastfeeding problems. Work with your health care provider or breastfeeding specialist (Advertising copywriter) to find strategies that work best for you. How does self-care during breastfeeding benefit me? Keeping your breasts healthy and ensuring that your baby attaches to your nipple well (good latch) are important components of a good breastfeeding experience. A good latch ensures that you will avoid common problems, such as: Cracked or sore nipples. Breasts becoming overfilled with milk (engorgement). Plugged milk ducts. Low milk supply. Breast inflammation or infection. How does self-care benefit my baby? By taking steps to avoid breastfeeding problems, you help ensure that your baby can feed effectively and gain weight as he or she should. What actions can I take to care for myself during breastfeeding? Breastfeeding strategy Always make sure that your baby latches and is in a proper position. Try different breastfeeding positions to find one that works best for you and your baby. Breastfeed when you feel the need to reduce the fullness of your breasts or when your baby shows signs of hunger. This is called "breastfeeding on demand." Do not delay feedings. Try to relax when it is time to feed your baby. This helps to trigger your let-down reflex, which releases milk from your breast. To help increase milk flow: Pump or hand express a small amount of breast milk right before breastfeeding to soften your breast, areola, and nipple. Apply warm, moist heat to your breast right before feeding to increase circulation and help milk flow. You can do this in the shower or with hand towels soaked with warm water. Massage your breast right before or during feeding to increase  circulation and help milk flow. Breast care Ensure that your breasts stay moisturized and healthy. This will help prevent cracking and ease soreness. To do this: Avoid using soap on your nipples. Let your nipples air-dry for 3-4 minutes after each feeding. Do not use drying aids like a hair dryer to dry breasts. This can cause the skin to further become dry, leading to more irritation. Use only cotton bra pads to absorb breast milk that leaks. Be sure to change the pads if they become soaked with milk. If you use disposable bra pads, change them often. Use lanolin on your nipples after nursing. If you use pure lanolin, you do not need to wash it off before feeding your baby again. Pure lanolin is not poisonous to your baby. Massage some breast milk into your nipples. Use  your hand to squeeze out a few drops of breast milk (hand express). Gently massage the milk into your nipples. Let your nipples air-dry. Wear a supportive nursing bra. Avoid wearing tight clothing, bras that put pressure on your breasts, or underwire bras. Use cold therapy to help relieve pain or swelling of your breasts. To do this: Put ice in a plastic bag. Place a towel between your skin and the bag. Leave the ice on for 20 minutes, 2-3 times a day.      Follow these instructions at home: Drink enough fluid to keep your urine pale yellow. Get plenty of rest. Sleep when your baby sleeps. Talk to your health care provider or lactation consultant before taking any herbal supplements. Eat foods that have good nutrients. Eat a balanced diet of fruits, vegetables, whole grains, lean proteins, and dairy or dairy alternatives. Contact a health care provider if: You have nipple pain. You have cracking or soreness in your nipples that lasts longer than 1 week. You have breast engorgement that lasts longer than 48 hours. You have a fever. You have pus-like discharge coming from your nipple. You have redness, a rash, swelling,  itching, or burning on your breast. Your baby does not gain weight or loses weight. Your baby is not feeding regularly or is very sleepy or lethargic. Summary Keeping your breasts healthy and ensuring a good latch are important components of a good breastfeeding experience. There are things that you can do to take care of yourself and help prevent common breastfeeding problems. Work with your health care provider or breastfeeding specialist (Advertising copywriterlactation consultant) to find strategies that work best for you. Always make sure that your baby is latched and positioned properly. Try different breastfeeding positions to find one that works best for you and your baby. Keep your nipples moisturized, drink plenty of fluid, and get plenty of rest. Feed on demand, and do not delay feedings. This information is not intended to replace advice given to you by your health care provider. Make sure you discuss any questions you have with your health care provider. Document Revised: 10/20/2019 Document Reviewed: 10/20/2019 Elsevier Patient Education  2021 Else[employer] Inc. Breast Pumping Tips There may be times when you cannot feed your baby from your breast, such as when you are at work or on a trip. Breast pumping allows you to remove milk from your breast in order to store it for later use. There are three ways to pump. You can: Use your hand to massage and squeeze your breast (hand expression). Use a handheld manual pump. Use an electric pump. When you first start to pump, you may not get much milk, but after a few days your breasts should start to make more. Pumping can help stimulate your milk supply after your baby is born. It can also help maintain your milk supply when you are away from your baby. When should I pump? You can start pumping soon after your baby is born. Talk with a lactation consultant about when it is best for you to start pumping. Here are some general tips on when to pump: When with your  baby: Pump after breastfeeding. Pump from the free breast while you breastfeed. When away from your baby: Pump every 2-3 hours for about 15 minutes. Pump both breasts at the same time if you can. If your baby gets formula feeding, pump around the time your baby gets that feeding. If you drank alcohol, wait 2 hours before pumping. If you are  having a procedure with anesthesia, talk to your health care provider about when you should pump before and after. How do I prepare to pump? Make sure you are relaxed. This makes it easier to stimulate your let-down reflex, which is what makes breast milk flow. To do this: Look at a picture of your baby or keep an article of clothing that smells like your baby close by. Sit in a quiet, private space. Place a warm cloth on your breast. The cloth should be a little wet. Massage your breast and nipple. Play relaxing music. Picture your milk flowing. Drink water and have a snack. What are some tips? General tips for pumping breast milk Always wash your hands with soap and water for at least 20 seconds before pumping. If you are not getting very much milk or pumping is uncomfortable, make adjustments to your pump or try using different types of pumps. Drink enough fluid to keep your urine pale yellow. Wear clothing that opens in the front or allows easy access to your breasts. Pump breast milk directly into clean bottles or other storage containers. Do not use any products that contain nicotine or tobacco. These products include cigarettes, chewing tobacco, and vaping devices, such as e-cigarettes. If you need help quitting, ask your health care provider. Get a hands-free pumping bra, if possible. This allows for a much more relaxing experience. You can buy one or make your own.   Tips for storing breast milk Store breast milk in a clean, BPA-free container such as glass, plastic bottles, or milk storage bags. Store breast milk in 2-4 ounce batches to  reduce waste. Do not shake breast milk. Swirl it in the container to mix any cream that floats to the top. Label all stored milk with the date you pumped it. Ensure that all stored breast milk is as clean as it can be. The amount of time you can keep breast milk depends on where it is stored: Room temperature: 6-8 hours, best if used within 4 hours. Cooler with ice packs: 24 hours. Refrigerator: 5-8 days, best if used within 3 days. Freezer: 9-12 months, best if used within 6 months. When using a refrigerator or freezer, put the milk in the back to keep it as cold as possible. Do not use the microwave to thaw frozen milk. Use warm water.   Tips for choosing a breast pump The right pump for you will depend on your comfort and how often you will be away from your baby. When choosing a pump, consider the following: Manual breast pumps do not need electricity to work. They are usually cheaper than electric pumps, but they can be harder to use. They may be a good choice if you are occasionally away from your baby. Electric breast pumps are usually more expensive than manual pumps, but they can be easier to use. They can also collect more milk than manual pumps. This makes them a good choice for women who work in an office or need to be away from their baby for long periods of time. The suction cup (flange) should be the right size. If it is the wrong size, it may cause pain and nipple damage. Before buying a pump, find out whether your insurance covers the cost of a breast pump. Tips for maintaining a breast pump Check your pump's manual for cleaning tips. Avoid touching the inside of pump parts that come in contact with breast milk. Clean the pump after each use. To do this:  Do not put the electrical unit in water or cleaning products. Wipe down the electrical unit with a dry, soft cloth or clean paper towel. Wash the plastic pump parts with soap and warm water or in the dishwasher, if the parts  are dishwasher safe. You do not need to clean the tubing unless it comes in contact with breast milk. Let the parts air dry. Avoid drying them with a cloth or towel. When the pump parts are clean and dry, put the pump back together. Then store the pump. When used correctly, breast pump tubing does not touch the pumped milk and does not need to be cleaned routinely. If there are water droplets in the tubing when it comes time to pump, attach the tubing to the pump and turn on the pump. Run the pump until the tube is dry. Summary Pumping can help stimulate your milk supply after your baby is born. It can also help maintain your milk supply when you are away from your baby. When you are away from your infant for several hours, pump for about 15 minutes every 2-3 hours. Pump both breasts at the same time, if you can. The right pump for you depends on your comfort, work schedule, and how often you may be away from your baby. This information is not intended to replace advice given to you by your health care provider. Make sure you discuss any questions you have with your health care provider. Document Revised: 02/09/2020 Document Reviewed: 02/09/2020 Elsevier Patient Education  2021 [employer] Inc.  Vaginal Delivery, Care After Refer to this sheet in the next few weeks. These discharge instructions provide you with information on caring for yourself after delivery. Your caregiver may also give you specific instructions. Your treatment has been planned according to the most current medical practices available, but problems sometimes occur. Call your caregiver if you have any problems or questions after you go home. HOME CARE INSTRUCTIONS 1. Take over-the-counter or prescription medicines only as directed by your caregiver or pharmacist. 2. Do not drink alcohol, especially if you are breastfeeding or taking medicine to relieve pain. 3. Do not smoke tobacco. 4. Continue to use good perineal care. Good  perineal care includes: 1. Wiping your perineum from back to front 2. Keeping your perineum clean. 3. You can do sitz baths twice a day, to help keep this area clean 5. Do not use tampons, douche or have sex until your caregiver says it is okay. 6. Shower only and avoid sitting in submerged water, aside from sitz baths 7. Wear a well-fitting bra that provides breast support. 8. Eat healthy foods. 9. Drink enough fluids to keep your urine clear or pale yellow. 10. Eat high-fiber foods such as whole grain cereals and breads, brown rice, beans, and fresh fruits and vegetables every day. These foods may help prevent or relieve constipation. 11. Avoid constipation with high fiber foods or medications, such as miralax or metamucil 12. Follow your caregiver's recommendations regarding resumption of activities such as climbing stairs, driving, lifting, exercising, or traveling. 13. Talk to your caregiver about resuming sexual activities. Resumption of sexual activities is dependent upon your risk of infection, your rate of healing, and your comfort and desire to resume sexual activity. 14. Try to have someone help you with your household activities and your newborn for at least a few days after you leave the hospital. 15. Rest as much as possible. Try to rest or take a nap when your newborn is sleeping. 16.  Increase your activities gradually. 17. Keep all of your scheduled postpartum appointments. It is very important to keep your scheduled follow-up appointments. At these appointments, your caregiver will be checking to make sure that you are healing physically and emotionally. SEEK MEDICAL CARE IF:   You are passing large clots from your vagina. Save any clots to show your caregiver.  You have a foul smelling discharge from your vagina.  You have trouble urinating.  You are urinating frequently.  You have pain when you urinate.  You have a change in your bowel movements.  You have increasing  redness, pain, or swelling near your vaginal incision (episiotomy) or vaginal tear.  You have pus draining from your episiotomy or vaginal tear.  Your episiotomy or vaginal tear is separating.  You have painful, hard, or reddened breasts.  You have a severe headache.  You have blurred vision or see spots.  You feel sad or depressed.  You have thoughts of hurting yourself or your newborn.  You have questions about your care, the care of your newborn, or medicines.  You are dizzy or light-headed.  You have a rash.  You have nausea or vomiting.  You were breastfeeding and have not had a menstrual period within 12 weeks after you stopped breastfeeding.  You are not breastfeeding and have not had a menstrual period by the 12th week after delivery.  You have a fever. SEEK IMMEDIATE MEDICAL CARE IF:   You have persistent pain.  You have chest pain.  You have shortness of breath.  You faint.  You have leg pain.  You have stomach pain.  Your vaginal bleeding saturates two or more sanitary pads in 1 hour. MAKE SURE YOU:   Understand these instructions.  Will watch your condition.  Will get help right away if you are not doing well or get worse. Document Released: 04/27/2000 Document Revised: 09/14/2013 Document Reviewed: 12/26/2011 [facility] Patient Information 2015 [place], [place]. This information is not intended to replace advice given to you by your health care provider. Make sure you discuss any questions you have with your health care provider.  Sitz Bath A sitz bath is a warm water bath taken in the sitting position. The water covers only the hips and butt (buttocks). We recommend using one that fits in the toilet, to help with ease of use and cleanliness. It may be used for either healing or cleaning purposes. Sitz baths are also used to relieve pain, itching, or muscle tightening (spasms). The water may contain medicine. Moist heat will help you heal and relax.   HOME CARE  Take 3 to 4 sitz baths a day. 18. Fill the bathtub half-full with warm water. 19. Sit in the water and open the drain a little. 20. Turn on the warm water to keep the tub half-full. Keep the water running constantly. 21. Soak in the water for 15 to 20 minutes. 22. After the sitz bath, pat the affected area dry. GET HELP RIGHT AWAY IF: You get worse instead of better. Stop the sitz baths if you get worse. MAKE SURE YOU:  Understand these instructions.  Will watch your condition.  Will get help right away if you are not doing well or get worse. Document Released: 06/07/2004 Document Revised: 01/23/2012 Document Reviewed: 08/28/2010 [facility] Patient Information 2015 [place], [place]. This information is not intended to replace advice given to you by your health care provider. Make sure you discuss any questions you have with your health care provider.

## 2020-09-29 NOTE — Lactation Note (Signed)
This note was copied from a baby's chart. Lactation Consultation Note  Patient Name: Girl Avaya Mcjunkins AOZHY'Q Date: 09/29/2020 Reason for consult: Follow-up assessment;Early term 37-38.6wks Age:26 hours  Lactation visit prior to anticipated discharge. Baby active at breast when LC entered; LC praised position/alignment, flanged lips, and audible swallows. Mom reports BF her first child for 7.5 months, with no overall BF concerns. Reviewed newborn stomach size and feeding patterns/behaviors, growth spurts/cluster feeding, feeding with cues, tracking output, skin to skin, and delaying introduction to artifical nipples (pacifiers and bottles). Education given to mom on anticipated breast changes- breast fullness and engorgement, possible need to soften the breast tissue prior to latch, management of fullness, and nipple care. Information for outpatient lactation services and community breastfeeding resources given, encouraged to call with questions/concerns and ongoing BF support.  Maternal Data Has patient been taught Hand Expression?: Yes Does the patient have breastfeeding experience prior to this delivery?: Yes How long did the patient breastfeed?: 7 months  Feeding Mother's Current Feeding Choice: Breast Milk  LATCH Score Latch: Grasps breast easily, tongue down, lips flanged, rhythmical sucking.  Audible Swallowing: A few with stimulation  Type of Nipple: Everted at rest and after stimulation  Comfort (Breast/Nipple): Soft / non-tender  Hold (Positioning): No assistance needed to correctly position infant at breast.  LATCH Score: 9   Lactation Tools Discussed/Used    Interventions Interventions: Breast feeding basics reviewed;Hand express;Education  Discharge Discharge Education: Engorgement and breast care;Warning signs for feeding baby;Outpatient recommendation  Consult Status Consult Status: Complete Date: 09/29/20 Follow-up type: Call as needed    Danford Bad 09/29/2020, 10:22 AM

## 2020-09-29 NOTE — Progress Notes (Signed)
Patient discharged home with infant. Discharge instructions, prescriptions, and follow-up appointment were given to and reviewed with patient. Patient verbalized understanding. Escorted out by auxiliary.  

## 2020-10-24 NOTE — Progress Notes (Deleted)
NEUROLOGY CONSULTATION NOTE  ALEESHA RINGSTAD MRN: [id number] DOB: Jun 18, 1995  Referring provider: Gustavo Lah, CNM Primary care provider: [facility]  Reason for consult:  Headaches  Assessment/Plan:      Subjective:  Laura Pitts is a 25 year old ***-handed female with migraines, Bipolar affective disorder and anxiety/depression who presents for headaches.  History supplemented by referring provider's notes.  ***  CT head to evaluate headache on 06/26/2017 personally reviewed was normal.  ***.  She subsequently gave birth on 09/28/2020 via SVD with no complications.  Current NSAIDS/analgesics:  *** Current triptans:  *** Current ergotamine:  *** Current anti-emetic:  *** Current muscle relaxants:  *** Current Antihypertensive medications:  *** Current Antidepressant medications:  *** Current Anticonvulsant medications:  *** Current anti-CGRP:  *** Current Vitamins/Herbal/Supplements:  *** Current Antihistamines/Decongestants:  *** Other therapy:  *** Hormone/birth control:  *** Other medications:  ***  Past NSAIDS/analgesics:  ibuprofen, Mobic Past abortive triptans:  sumatriptan 100mg  Past abortive ergotamine:  *** Past muscle relaxants:  Robaxin Past anti-emetic:  *** Past antihypertensive medications:  *** Past antidepressant medications:  amitriptyline, venlafaxine XR, sertraline, trazodone Past anticonvulsant medications:  gabapentin, lamotrigine Past anti-CGRP:  none Past vitamins/Herbal/Supplements:  none Past antihistamines/decongestants:  none Other past therapies:  ***  Caffeine:  *** Alcohol:  *** Smoker:  *** Diet:  *** Exercise:  *** Depression:  ***; Anxiety:  *** Other pain:  *** Sleep hygiene:  *** Family history of headache:  ***      PAST MEDICAL HISTORY: Past Medical History:  Diagnosis Date   Anxiety    Bipolar affective disorder (HCC)    Depression    Migraine     PAST SURGICAL HISTORY: Past Surgical  History:  Procedure Laterality Date   INTRAUTERINE DEVICE (IUD) INSERTION  11/07/12, 01/19/2015   Skyla   NO PAST SURGERIES     tubes in ears      MEDICATIONS: No current outpatient medications on file prior to visit.   No current facility-administered medications on file prior to visit.    ALLERGIES: Allergies  Allergen Reactions   Latex    Venlafaxine Other (See Comments)    Too tired Too tired    FAMILY HISTORY: Family History  Problem Relation Age of Onset   Migraines Mother    Hypertension Father    Brain cancer Maternal Grandmother    Lung cancer Maternal Grandfather     Objective:   General: No acute distress.  Patient appears well-groomed.   Head:  Normocephalic/atraumatic Eyes:  fundi examined but not visualized Neck: supple, no paraspinal tenderness, full range of motion Back: No paraspinal tenderness Heart: regular rate and rhythm Lungs: Clear to auscultation bilaterally. Vascular: No carotid bruits. Neurological Exam: Mental status: alert and oriented to person, place, and time, recent and remote memory intact, fund of knowledge intact, attention and concentration intact, speech fluent and not dysarthric, language intact. Cranial nerves: CN I: not tested CN II: pupils equal, round and reactive to light, visual fields intact CN III, IV, VI:  full range of motion, no nystagmus, no ptosis CN V: facial sensation intact. CN VII: upper and lower face symmetric CN VIII: hearing intact CN IX, X: gag intact, uvula midline CN XI: sternocleidomastoid and trapezius muscles intact CN XII: tongue midline Bulk & Tone: normal, no fasciculations. Motor:  muscle strength 5/5 throughout Sensation:  Pinprick, temperature and vibratory sensation intact. Deep Tendon Reflexes:  2+ throughout,  toes downgoing.   Finger to nose  testing:  Without dysmetria.   Heel to shin:  Without dysmetria.   Gait:  Normal station and stride.  Romberg negative.    Thank you for  allowing me to take part in the care of this patient.  Shon Millet, DO  CC:

## 2020-10-25 ENCOUNTER — Ambulatory Visit: Payer: Medicaid Other | Admitting: Neurology

## 2022-11-02 DIAGNOSIS — Z3490 Encounter for supervision of normal pregnancy, unspecified, unspecified trimester: Secondary | ICD-10-CM | POA: Insufficient documentation

## 2022-11-27 DIAGNOSIS — O99320 Drug use complicating pregnancy, unspecified trimester: Secondary | ICD-10-CM | POA: Insufficient documentation

## 2022-11-27 LAB — OB RESULTS CONSOLE RPR: RPR: NONREACTIVE

## 2022-11-27 LAB — OB RESULTS CONSOLE HEPATITIS B SURFACE ANTIGEN: Hepatitis B Surface Ag: NEGATIVE

## 2022-11-27 LAB — OB RESULTS CONSOLE HIV ANTIBODY (ROUTINE TESTING): HIV: NONREACTIVE

## 2022-11-27 LAB — OB RESULTS CONSOLE RUBELLA ANTIBODY, IGM: Rubella: IMMUNE

## 2022-11-27 LAB — OB RESULTS CONSOLE GC/CHLAMYDIA
Chlamydia: NEGATIVE
Neisseria Gonorrhea: NEGATIVE

## 2022-11-27 LAB — HEPATITIS C ANTIBODY: HCV Ab: NEGATIVE

## 2022-11-27 LAB — OB RESULTS CONSOLE VARICELLA ZOSTER ANTIBODY, IGG: Varicella: IMMUNE

## 2023-03-19 ENCOUNTER — Other Ambulatory Visit: Payer: Self-pay

## 2023-03-19 ENCOUNTER — Encounter: Payer: Self-pay | Admitting: Obstetrics and Gynecology

## 2023-03-19 ENCOUNTER — Observation Stay
Admission: EM | Admit: 2023-03-19 | Discharge: 2023-03-19 | Disposition: A | Discharge status: Home / Self Care | Payer: Medicaid Other | Attending: Certified Nurse Midwife | Admitting: Obstetrics and Gynecology

## 2023-03-19 ENCOUNTER — Observation Stay: Payer: Medicaid Other

## 2023-03-19 DIAGNOSIS — O99332 Smoking (tobacco) complicating pregnancy, second trimester: Secondary | ICD-10-CM | POA: Diagnosis not present

## 2023-03-19 DIAGNOSIS — Z3A26 26 weeks gestation of pregnancy: Secondary | ICD-10-CM | POA: Diagnosis not present

## 2023-03-19 DIAGNOSIS — R109 Unspecified abdominal pain: Secondary | ICD-10-CM | POA: Insufficient documentation

## 2023-03-19 DIAGNOSIS — Z91040 Latex allergy status: Secondary | ICD-10-CM | POA: Insufficient documentation

## 2023-03-19 DIAGNOSIS — Z79899 Other long term (current) drug therapy: Secondary | ICD-10-CM | POA: Diagnosis not present

## 2023-03-19 DIAGNOSIS — O26892 Other specified pregnancy related conditions, second trimester: Principal | ICD-10-CM | POA: Insufficient documentation

## 2023-03-19 DIAGNOSIS — S3991XA Unspecified injury of abdomen, initial encounter: Principal | ICD-10-CM | POA: Diagnosis present

## 2023-03-19 DIAGNOSIS — F17210 Nicotine dependence, cigarettes, uncomplicated: Secondary | ICD-10-CM | POA: Diagnosis not present

## 2023-03-19 LAB — RUPTURE OF MEMBRANE (ROM)PLUS: Rom Plus: NEGATIVE

## 2023-03-19 LAB — WET PREP, GENITAL
Clue Cells Wet Prep HPF POC: NONE SEEN
Sperm: NONE SEEN
Trich, Wet Prep: NONE SEEN
WBC, Wet Prep HPF POC: <10
Yeast Wet Prep HPF POC: NONE SEEN

## 2023-03-19 LAB — URINALYSIS, ROUTINE W REFLEX MICROSCOPIC
Bilirubin Urine: NEGATIVE
Glucose, UA: NEGATIVE mg/dL
Hgb urine dipstick: NEGATIVE
Ketones, ur: NEGATIVE mg/dL
Leukocytes,Ua: NEGATIVE
Nitrite: NEGATIVE
Protein, ur: NEGATIVE mg/dL
Specific Gravity, Urine: 1.017 (ref 1.005–1.030)
pH: 6 (ref 5.0–8.0)

## 2023-03-19 MED ORDER — CALCIUM CARBONATE ANTACID 500 MG PO CHEW: 2.0000 | CHEWABLE_TABLET | ORAL | Status: DC | PRN

## 2023-03-19 MED ORDER — ACETAMINOPHEN 325 MG PO TABS: 650.0000 mg | ORAL_TABLET | ORAL | Status: DC | PRN

## 2023-03-19 NOTE — OB Triage Note (Signed)
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 Patient reports feeling cramping since then and occasional fetal movement. Denies LOF and vaginal bleeding. Reports clear discharge since incident as well. External monitors applied and assessing. Initial FHT 135. Vital sign WDL.

## 2023-03-19 NOTE — OB Triage Note (Signed)

## 2023-03-19 NOTE — Discharge Summary (Addendum)
Laura Pitts is a 27 y.o. female. She is at [redacted]w[redacted]d gestation. Patient's last menstrual period was 09/12/2022 (exact date). 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 She picked up a toddler at work and got kicked in the abdomen around 0745. She reports abdominal pain and cramping and believed she might be leaking fluid. She endorses good fetal movement and denies any vaginal bleeding.  S: Resting comfortably. no CTX, no VB.no LOF,  Active fetal movement.  Denies: HA, visual changes, SOB, or RUQ/epigastric pain  Maternal Medical History:   Past Medical History:  Diagnosis Date   Anxiety    Bipolar affective disorder (HCC)    Depression    Migraine     Past Surgical History:  Procedure Laterality Date   INTRAUTERINE DEVICE (IUD) INSERTION  11/07/12, 01/19/2015   Skyla   NO PAST SURGERIES     tubes in ears      Allergies  Allergen Reactions   Latex    Venlafaxine Other (See Comments)    Too tired Too tired    Prior to Admission medications   Medication Sig Start Date End Date Taking? Authorizing Provider  lamoTRIgine (LAMICTAL) 200 MG tablet Take 200 mg by mouth daily.   Yes [provider]      Social History: She  reports that she has been smoking cigarettes. She has never used smokeless tobacco. She reports current alcohol use. She reports current drug use. Drug: Marijuana.  Family History: family history includes Brain cancer in her maternal grandmother; Hypertension in her father; Lung cancer in her maternal grandfather; Migraines in her mother.  no history of gyn cancers  Review of Systems: A full review of systems was performed and negative except as noted in the HPI.     O:  BP 119/67 (BP Location: Left Arm)    Pulse 90   Temp 98.5 F (36.9 C) (Oral)   Resp 18   Ht 5\' 5"  (1.651 m)   Wt 81.6 kg   LMP 09/12/2022 (Exact Date)   BMI 29.95 kg/m  Results for orders placed or performed during the hospital encounter of 03/19/23 (from the past 48 hour(s))  Rupture of Membrane (ROM) Plus   Collection Time: 03/19/23  9:53 AM  Result Value Ref Range   Rom Plus NEGATIVE   Wet prep, genital   Collection Time: 03/19/23 10:01 AM  Result Value Ref Range   Yeast Wet Prep HPF POC NONE SEEN NONE SEEN   Trich, Wet Prep NONE SEEN NONE SEEN   Clue Cells Wet Prep HPF POC NONE SEEN NONE SEEN   WBC, Wet Prep HPF POC <10 <10   Sperm NONE SEEN   Urinalysis, Routine w reflex microscopic -Urine, Clean Catch   Collection Time: 03/19/23 10:01 AM  Result Value Ref Range   Color, Urine YELLOW (A) YELLOW   APPearance HAZY (A) CLEAR   Specific Gravity, Urine 1.017 1.005 - 1.030   pH 6.0 5.0 - 8.0   Glucose, UA NEGATIVE NEGATIVE mg/dL   Hgb urine dipstick NEGATIVE NEGATIVE   Bilirubin Urine NEGATIVE NEGATIVE   Ketones, ur NEGATIVE NEGATIVE mg/dL   Protein, ur NEGATIVE NEGATIVE mg/dL   Nitrite NEGATIVE NEGATIVE   Leukocytes,Ua NEGATIVE NEGATIVE     Constitutional: NAD,  AAOx3  HE/ENT: extraocular movements grossly intact, moist mucous membranes CV: RRR PULM: nl respiratory effort, CTABL     Abd: gravid, non-tender, non-distended, soft      Ext: Non-tender, Nonedematous   Psych: mood appropriate, speech normal Pelvic: SSE done, cervix closed and thick, normal physiologic discharge present, no fluid coming from cervix  Pelvic exam: normal external genitalia, vulva, vagina, cervix, uterus and adnexa.  Fetal  monitoring: Cat 1 Appropriate for GA Baseline: 130bpm Variability: moderate Accelerations: present x >2 Decelerations absent Uterine contractions: not present Time 3 hours Periodically recording maternal heart rate  OB US:  LIMITED OBSTETRIC ULTRASOUND   COMPARISON:  None Available.    FINDINGS: Number of Fetuses: 1   Heart Rate:  133 bpm   Movement: Yes   Presentation: Cephalic   Placental Location: Posterior   Previa: No   Amniotic Fluid (Subjective):  Within normal limits.   AFI: 10.7 cm.  This is between the fifth and fiftieth percentiles.   BPD: 6.8 cm 27 w  3 d   MATERNAL FINDINGS:   Cervix:  Appears closed.  Maternal cervical length: 3.0 cm.   Uterus/Adnexae: No abnormality visualized.   IMPRESSION: Single live intrauterine pregnancy with biparietal diameter corresponding to estimated gestational age of [redacted] weeks 3 days.   Normal position of the placenta.  No acute abnormality is seen.   This exam is performed on an emergent basis and does not comprehensively evaluate fetal size, dating, or anatomy; follow-up complete OB US should be considered if further fetal assessment is warranted.  A/P: 27 y.o. [redacted]w[redacted]d here for antenatal surveillance for abdominal pain and cramping after being kicked in the abdomen  Principle Diagnosis:  Abdominal pain in pregnancy  Abdominal pain: no contractions present, cervix closed, abdomen soft/non-tender, reassuring OB US. Blood type A positive. She was monitored until 4.5 hours after the event with no concerns and no evidence of placenta abruption. Reviewed fetal kick counts and to return if fkc change or worsening abdominal pain or contractions or bleeding. Will discharge home.  Labor: not present.  Fetal Wellbeing: Reassuring Cat 1 tracing. Reactive NST  D/c home stable, precautions reviewed, follow-up as scheduled.    Laura Pitts, CNM 03/19/2023 12:40 PM

## 2023-03-29 LAB — OB RESULTS CONSOLE RPR: RPR: NONREACTIVE

## 2023-05-15 NOTE — L&D Delivery Note (Signed)
Delivery Note  First Stage: Labor onset: 2300 Augmentation : AROM Analgesia /Anesthesia intrapartum: none AROM at 1913  Second Stage: Complete dilation at 1951 Onset of pushing at 1951 FHR second stage n/a, imminent delivery upon feeling the urge to push  Delivery of a viable female infant 06/08/2023 at 1952 by Donato Schultz, CNM delivery of fetal head in OA position with restitution to LOA. Loose nuchal cord x1;  Anterior then posterior shoulders delivered easily with gentle downward traction. Baby placed on mom's chest, and attended to by peds.  Cord double clamped after cessation of pulsation, cut by [name]  Third Stage: Placenta delivered Tomasa Blase intact with 3 VC @ 2006, trailing membranes removed with ring forceps Placenta disposition: discarded Uterine tone firm / bleeding scant  No laceration identified  Anesthesia for repair: n/a Repair none needed Est. Blood Loss (mL):  Complications: none  Mom to postpartum.  Baby to Couplet care / Skin to Skin.  Newborn: Birth Weight: TBD, infant skin-to-skin  Apgar Scores: 9, 9 Feeding planned: breastfeeding

## 2023-05-21 DIAGNOSIS — Z8759 Personal history of other complications of pregnancy, childbirth and the puerperium: Secondary | ICD-10-CM | POA: Insufficient documentation

## 2023-05-29 LAB — OB RESULTS CONSOLE HIV ANTIBODY (ROUTINE TESTING): HIV: NONREACTIVE

## 2023-05-29 LAB — OB RESULTS CONSOLE GC/CHLAMYDIA
Chlamydia: NEGATIVE
Neisseria Gonorrhea: NEGATIVE

## 2023-05-29 LAB — OB RESULTS CONSOLE GBS: GBS: NEGATIVE

## 2023-06-04 ENCOUNTER — Other Ambulatory Visit: Payer: Self-pay | Admitting: Obstetrics and Gynecology

## 2023-06-04 DIAGNOSIS — F419 Anxiety disorder, unspecified: Secondary | ICD-10-CM

## 2023-06-04 DIAGNOSIS — O99320 Drug use complicating pregnancy, unspecified trimester: Secondary | ICD-10-CM

## 2023-06-04 DIAGNOSIS — Z3490 Encounter for supervision of normal pregnancy, unspecified, unspecified trimester: Secondary | ICD-10-CM

## 2023-06-04 DIAGNOSIS — O99013 Anemia complicating pregnancy, third trimester: Secondary | ICD-10-CM

## 2023-06-04 DIAGNOSIS — F32A Depression, unspecified: Secondary | ICD-10-CM

## 2023-06-04 DIAGNOSIS — Z8759 Personal history of other complications of pregnancy, childbirth and the puerperium: Secondary | ICD-10-CM

## 2023-06-04 NOTE — Progress Notes (Signed)
28 y.o. G3P2002 at [redacted]w[redacted]d Patient's last menstrual period was 09/12/2022 (exact date). consistent with  with ultrasound @ [redacted]w[redacted]d on 10/30/2022.Estimated Date of Delivery: 06/19/23 Sex of baby and name:  " "                          Partner:    Gerilyn Pilgrim  Factors complicating this pregnancy  Mild anemia  05/22/2023 - hgb 10.8, started on iron supplements  History of precipitous delivery Delivered en route to LDR room in last pregnancy (wheelchair delivery) Depression/anxiety  Meds: Lamictal  80 mg and Prozac 10 mg  and Latuda 25 mg -04/16/23 Provider: Psychiatrist Marijuana use in pregnancy  Encouraged to decrease and stop 11/27/2022 - UDS positive for THC and Nicotine  Repeat  UDS on admission to L&D  Nicotine use in pregnancy  If she continues to smoke- patient confirmed she still vapes 03/29/23 Begin EFW and AFI every 4 weeks between 28-32 weeks Weekly NST at 36 weeks  Screening results and needs: NOB:  Medicaid Questionnaire:  []  ACHD Program Depression Score: 10 MBT:  A + Ab screen:   Negative HIV:  Negative RPR:   NR Hep B: NegativeHep C: NR Pap: 11/27/2022 NILMG/C: neg/neg Rubella:  Immune  VZV: Immune TSH: 0.377HgA1C: 5.1 Aneuploidy:  First trimester:  MaternitT21: negative    SMA: NegCF carrier: Neg Second trimester (AFP/tetra): Negative 28 weeks:  Review Medicaid Questionnaire: []  ACHD Program Depression Score: 15- pt stated she just saw psychiatrist (03/26/23) and is having meds adjusted Blood consent: signed 03/29/23 Hgb: 12.1  Platelets: 264   Glucola: 87  Rhogam:n/a RPR: NR 36 weeks:  GBS:neg  (05/29/2023) G/C:neg/neg   Hgb:10.8  Platelets:263   HIV:Neg RPR:NR    Last [place]:  10/30/2022: Single, viable IUP seen; CRL 6.5 mm = [redacted]w[redacted]d; EDC by 06/22/2023; FHT: 125 bpm; Cervical length: 4.40cm; Right ovary: corpus luteum cyst 23.0 x 19.0 x 16.0 mm; Left ovary: WNL 01/29/23: EFW: 320 g 47%    Hadlock Cervix Visualized, Appearance: The cervical length is 3.9 cm. Right Ovary Visualized, Left  Ovary Visualized, Cardiac activity Present. FHR 145 bpm. Presentation: Breech Placenta: Placental site: Posterior. No evidence of previa 02/14/2023 FHR 151 bmp presentation: cephalic placenta posterior AFI 12.4 RVOT: normal LVOR: normal Kidney normal bladder present  03/29/23: FHR = 145bpm, EFW = 1205g (2lb 10oz) 37%, AFI = 10.7cm, AF normal, AC 44%, Presentation cephalic, Placenta posterior 04/15/23: cephalic, post plac, AFI-13.2 cm 05/02/23: Presentation=Cephalic, Placenta=Posterior, FHR= 131 BPM, AF normal, MVP 3.7 cm, AFI 13.4 cm, EFW 2244 g, 4 lb, 15 oz, 57% 05/29/23: Single IUP, [redacted]w[redacted]d, presentation cephalic, FHR 124bpm, placenta posterior, AFI 12.1cm, EFW is 3128g at 60%, 6lb14oz Immunization:   Flu in season - declined Tdap at 27-36 weeks -given [facility] 04/16/23 RSV at 32-36 weeks - given 05/02/23 JW Contraception Plan: BTL consent signed 04/16/23 AC Feeding Plan: Breastfeeding Labor Plans:

## 2023-06-08 ENCOUNTER — Encounter: Payer: Self-pay | Admitting: Obstetrics and Gynecology

## 2023-06-08 ENCOUNTER — Observation Stay
Admission: EM | Admit: 2023-06-08 | Discharge: 2023-06-08 | Disposition: A | Discharge status: Home / Self Care | Payer: Medicaid Other | Source: Home / Self Care | Admitting: Obstetrics and Gynecology

## 2023-06-08 ENCOUNTER — Other Ambulatory Visit: Payer: Self-pay

## 2023-06-08 ENCOUNTER — Inpatient Hospital Stay
Admission: EM | Admit: 2023-06-08 | Discharge: 2023-06-09 | DRG: 806 | Disposition: A | Discharge status: Home / Self Care | Payer: Medicaid Other | Attending: Certified Nurse Midwife | Admitting: Certified Nurse Midwife

## 2023-06-08 DIAGNOSIS — O9081 Anemia of the puerperium: Secondary | ICD-10-CM | POA: Diagnosis not present

## 2023-06-08 DIAGNOSIS — F32A Depression, unspecified: Secondary | ICD-10-CM | POA: Insufficient documentation

## 2023-06-08 DIAGNOSIS — O99324 Drug use complicating childbirth: Secondary | ICD-10-CM | POA: Diagnosis present

## 2023-06-08 DIAGNOSIS — Z8759 Personal history of other complications of pregnancy, childbirth and the puerperium: Secondary | ICD-10-CM

## 2023-06-08 DIAGNOSIS — D62 Acute posthemorrhagic anemia: Secondary | ICD-10-CM | POA: Diagnosis not present

## 2023-06-08 DIAGNOSIS — O471 False labor at or after 37 completed weeks of gestation: Secondary | ICD-10-CM | POA: Insufficient documentation

## 2023-06-08 DIAGNOSIS — O99334 Smoking (tobacco) complicating childbirth: Secondary | ICD-10-CM | POA: Diagnosis present

## 2023-06-08 DIAGNOSIS — F1290 Cannabis use, unspecified, uncomplicated: Secondary | ICD-10-CM | POA: Diagnosis present

## 2023-06-08 DIAGNOSIS — Z91040 Latex allergy status: Secondary | ICD-10-CM

## 2023-06-08 DIAGNOSIS — O99340 Other mental disorders complicating pregnancy, unspecified trimester: Secondary | ICD-10-CM | POA: Diagnosis present

## 2023-06-08 DIAGNOSIS — Z79899 Other long term (current) drug therapy: Secondary | ICD-10-CM

## 2023-06-08 DIAGNOSIS — Z3490 Encounter for supervision of normal pregnancy, unspecified, unspecified trimester: Secondary | ICD-10-CM

## 2023-06-08 DIAGNOSIS — O99344 Other mental disorders complicating childbirth: Secondary | ICD-10-CM | POA: Diagnosis present

## 2023-06-08 DIAGNOSIS — F17210 Nicotine dependence, cigarettes, uncomplicated: Secondary | ICD-10-CM | POA: Insufficient documentation

## 2023-06-08 DIAGNOSIS — O99013 Anemia complicating pregnancy, third trimester: Secondary | ICD-10-CM | POA: Diagnosis present

## 2023-06-08 DIAGNOSIS — O99333 Smoking (tobacco) complicating pregnancy, third trimester: Secondary | ICD-10-CM | POA: Insufficient documentation

## 2023-06-08 DIAGNOSIS — F419 Anxiety disorder, unspecified: Secondary | ICD-10-CM | POA: Diagnosis present

## 2023-06-08 DIAGNOSIS — Z8249 Family history of ischemic heart disease and other diseases of the circulatory system: Secondary | ICD-10-CM | POA: Diagnosis not present

## 2023-06-08 DIAGNOSIS — Z3A38 38 weeks gestation of pregnancy: Secondary | ICD-10-CM | POA: Insufficient documentation

## 2023-06-08 DIAGNOSIS — O99320 Drug use complicating pregnancy, unspecified trimester: Secondary | ICD-10-CM | POA: Diagnosis present

## 2023-06-08 DIAGNOSIS — O479 False labor, unspecified: Principal | ICD-10-CM | POA: Diagnosis present

## 2023-06-08 DIAGNOSIS — O26893 Other specified pregnancy related conditions, third trimester: Secondary | ICD-10-CM | POA: Diagnosis present

## 2023-06-08 LAB — CBC
HCT: 32.2 % — ABNORMAL LOW (ref 36.0–46.0)
Hemoglobin: 10.6 g/dL — ABNORMAL LOW (ref 12.0–15.0)
MCH: 25.2 pg — ABNORMAL LOW (ref 26.0–34.0)
MCHC: 32.9 g/dL (ref 30.0–36.0)
MCV: 76.7 fL — ABNORMAL LOW (ref 80.0–100.0)
Platelets: 306 10*3/uL (ref 150–400)
RBC: 4.2 MIL/uL (ref 3.87–5.11)
RDW: 14.3 % (ref 11.5–15.5)
WBC: 12.1 10*3/uL — ABNORMAL HIGH (ref 4.0–10.5)
nRBC: 0 % (ref 0.0–0.2)

## 2023-06-08 LAB — TYPE AND SCREEN
ABO/RH(D): A POS
Antibody Screen: NEGATIVE

## 2023-06-08 LAB — URINE DRUG SCREEN, QUALITATIVE (ARMC ONLY)
Amphetamines, Ur Screen: NOT DETECTED
Barbiturates, Ur Screen: NOT DETECTED
Benzodiazepine, Ur Scrn: NOT DETECTED
Cannabinoid 50 Ng, Ur ~~LOC~~: POSITIVE — AB
Cocaine Metabolite,Ur ~~LOC~~: NOT DETECTED
MDMA (Ecstasy)Ur Screen: NOT DETECTED
Methadone Scn, Ur: NOT DETECTED
Opiate, Ur Screen: NOT DETECTED
Phencyclidine (PCP) Ur S: NOT DETECTED
Tricyclic, Ur Screen: NOT DETECTED

## 2023-06-08 MED ORDER — SENNOSIDES-DOCUSATE SODIUM 8.6-50 MG PO TABS
2.0000 | ORAL_TABLET | Freq: Every day | ORAL | Status: DC
  Administered 2023-06-09: 2 via ORAL
  Filled 2023-06-08: qty 2

## 2023-06-08 MED ORDER — OXYTOCIN BOLUS FROM INFUSION
333.0000 mL | Freq: Once | INTRAVENOUS | Status: AC
  Administered 2023-06-08: 333 mL via INTRAVENOUS

## 2023-06-08 MED ORDER — ZOLPIDEM TARTRATE 5 MG PO TABS
5.0000 mg | ORAL_TABLET | Freq: Every evening | ORAL | Status: DC | PRN
Start: 2023-06-08 — End: 2023-06-10

## 2023-06-08 MED ORDER — ONDANSETRON HCL 4 MG PO TABS: 4.0000 mg | ORAL_TABLET | ORAL | Status: DC | PRN

## 2023-06-08 MED ORDER — LACTATED RINGERS IV SOLN: 500.0000 mL | INTRAVENOUS | Status: DC | PRN

## 2023-06-08 MED ORDER — TERBUTALINE SULFATE 1 MG/ML IJ SOLN: 0.2500 mg | Freq: Once | INTRAMUSCULAR | Status: DC | PRN

## 2023-06-08 MED ORDER — SOD CITRATE-CITRIC ACID 500-334 MG/5ML PO SOLN: 30.0000 mL | ORAL | Status: DC | PRN

## 2023-06-08 MED ORDER — ONDANSETRON HCL 4 MG/2ML IJ SOLN: 4.0000 mg | INTRAMUSCULAR | Status: DC | PRN

## 2023-06-08 MED ORDER — LAMOTRIGINE 100 MG PO TABS
200.0000 mg | ORAL_TABLET | Freq: Every day | ORAL | Status: DC
  Administered 2023-06-09: 200 mg via ORAL
  Filled 2023-06-08: qty 2

## 2023-06-08 MED ORDER — DIBUCAINE (PERIANAL) 1 % EX OINT
1.0000 | TOPICAL_OINTMENT | CUTANEOUS | Status: DC | PRN
  Filled 2023-06-08: qty 28

## 2023-06-08 MED ORDER — PRENATAL MULTIVITAMIN CH
1.0000 | ORAL_TABLET | Freq: Every day | ORAL | Status: DC
  Filled 2023-06-08: qty 1

## 2023-06-08 MED ORDER — OXYTOCIN 10 UNIT/ML IJ SOLN
10.0000 [IU] | Freq: Once | INTRAMUSCULAR | Status: DC
  Filled 2023-06-08: qty 1

## 2023-06-08 MED ORDER — OXYTOCIN-SODIUM CHLORIDE 30-0.9 UT/500ML-% IV SOLN
INTRAVENOUS | Status: AC
  Filled 2023-06-08: qty 500

## 2023-06-08 MED ORDER — BENZOCAINE-MENTHOL 20-0.5 % EX AERO
1.0000 | INHALATION_SPRAY | CUTANEOUS | Status: DC | PRN
  Filled 2023-06-08: qty 56

## 2023-06-08 MED ORDER — IBUPROFEN 600 MG PO TABS
ORAL_TABLET | ORAL | Status: AC
  Filled 2023-06-08: qty 1

## 2023-06-08 MED ORDER — LACTATED RINGERS IV SOLN: INTRAVENOUS | Status: DC

## 2023-06-08 MED ORDER — ONDANSETRON HCL 4 MG/2ML IJ SOLN
4.0000 mg | Freq: Four times a day (QID) | INTRAMUSCULAR | Status: DC | PRN
  Administered 2023-06-08: 4 mg via INTRAVENOUS
  Filled 2023-06-08: qty 2

## 2023-06-08 MED ORDER — SIMETHICONE 80 MG PO CHEW: 80.0000 mg | CHEWABLE_TABLET | ORAL | Status: DC | PRN

## 2023-06-08 MED ORDER — WITCH HAZEL-GLYCERIN EX PADS
1.0000 | MEDICATED_PAD | CUTANEOUS | Status: DC | PRN
  Filled 2023-06-08: qty 100

## 2023-06-08 MED ORDER — IBUPROFEN 600 MG PO TABS
600.0000 mg | ORAL_TABLET | Freq: Four times a day (QID) | ORAL | Status: DC
  Administered 2023-06-08 – 2023-06-09 (×4): 600 mg via ORAL
  Filled 2023-06-08 (×3): qty 1

## 2023-06-08 MED ORDER — MISOPROSTOL 200 MCG PO TABS
ORAL_TABLET | ORAL | Status: AC
  Filled 2023-06-08: qty 1

## 2023-06-08 MED ORDER — FLUOXETINE HCL 10 MG PO CAPS
10.0000 mg | ORAL_CAPSULE | Freq: Every day | ORAL | Status: DC
  Administered 2023-06-09: 10 mg via ORAL
  Filled 2023-06-08 (×2): qty 1

## 2023-06-08 MED ORDER — MISOPROSTOL 25 MCG QUARTER TABLET
25.0000 ug | ORAL_TABLET | ORAL | Status: DC | PRN
Start: 2023-06-08 — End: 2023-06-08

## 2023-06-08 MED ORDER — LURASIDONE HCL 40 MG PO TABS
120.0000 mg | ORAL_TABLET | Freq: Every day | ORAL | Status: DC
  Administered 2023-06-09: 120 mg via ORAL
  Filled 2023-06-08: qty 3

## 2023-06-08 MED ORDER — DIPHENHYDRAMINE HCL 25 MG PO CAPS
25.0000 mg | ORAL_CAPSULE | Freq: Four times a day (QID) | ORAL | Status: DC | PRN
Start: 2023-06-08 — End: 2023-06-10

## 2023-06-08 MED ORDER — LIDOCAINE HCL (PF) 1 % IJ SOLN
30.0000 mL | INTRAMUSCULAR | Status: DC | PRN
  Filled 2023-06-08: qty 30

## 2023-06-08 MED ORDER — COCONUT OIL OIL
1.0000 | TOPICAL_OIL | Status: DC | PRN
  Filled 2023-06-08: qty 7.5

## 2023-06-08 MED ORDER — ACETAMINOPHEN 325 MG PO TABS
650.0000 mg | ORAL_TABLET | ORAL | Status: DC | PRN
  Administered 2023-06-09 (×3): 650 mg via ORAL
  Filled 2023-06-08 (×3): qty 2

## 2023-06-08 MED ORDER — OXYTOCIN-SODIUM CHLORIDE 30-0.9 UT/500ML-% IV SOLN
2.5000 [IU]/h | INTRAVENOUS | Status: DC
  Administered 2023-06-08: 2.5 [IU]/h via INTRAVENOUS

## 2023-06-08 NOTE — Progress Notes (Signed)
Difficulty continuously monitoring FHT's and assess contractions on toco due to maternal movement. Monitor tracing maternal vs fetal heart tones at times. Wireless monitors in place and RN remains at bedside attempting to adjust. CNM remains at bedside.

## 2023-06-08 NOTE — Discharge Summary (Signed)
Postpartum Discharge Summary  Patient Name: Laura Pitts DOB: 07/07/1995 MRN: [id number]  Date of admission: 06/08/2023 Delivery date:06/08/2023 Delivering provider: Donato Schultz RENEE Date of discharge: 06/09/2023  Primary OB: [facility] [id number] last menstrual period was 09/12/2022 (exact date). EDC Estimated Date of Delivery: 06/19/23 Gestational Age at Delivery: [redacted]w[redacted]d   Admitting diagnosis: Encounter for elective induction of labor [Z34.90] Intrauterine pregnancy: [redacted]w[redacted]d     Secondary diagnosis:   Principal Problem:   NSVD (normal spontaneous vaginal delivery) Active Problems:   Depression affecting pregnancy   Marijuana use during pregnancy   Anemia affecting pregnancy in third trimester   Anxiety during pregnancy   Uterine contractions   History of precipitous delivery   Discharge Diagnosis: Term Pregnancy Delivered                                                Post partum procedures: none Augmentation:: AROM Complications: None Delivery Type: spontaneous vaginal delivery Anesthesia: non-pharmacological methods Placenta: spontaneous To Pathology: No  Laceration: none Episiotomy: none  Prenatal Labs:  Blood type/Rh A pos  Antibody screen neg  Rubella Immune  Varicella Immune  RPR NR  HBsAg Neg  HIV NR  GC neg  Chlamydia neg  Genetic screening negative  1 hour GTT 87  3 hour GTT    GBS Negative    Hospital course: Onset of Labor With Vaginal Delivery      28 y.o. yo G3P3003 at [redacted]w[redacted]d was admitted in Active Labor on 06/08/2023. Labor course was uncomplicated. AROM with clear fluid then progressed to 10/100/+3 and pushed once, delivering viable female infant over intact perineum. Apgars 9/9. Membrane Rupture Time/Date: 7:13 PM,06/08/2023  Delivery Method:Vaginal, Spontaneous Operative Delivery:N/A Episiotomy: None Lacerations:  None Patient had a postpartum course without complication.  She is ambulating, tolerating a regular diet,  passing flatus, and urinating well. Patient is discharged home in stable condition on 06/09/23.  Newborn Data: "Cayson" Birth date:06/08/2023 Birth time:7:52 PM Gender:Female Living status:Living Apgars:9 ,9  Weight:3450 g  Magnesium Sulfate received: No BMZ received: No Rhophylac:No MMR:No Varivax vaccine given: was not indicated T-DaP:Given prenatally Flu: No  Transfusion:No  Physical exam  Vitals:   06/08/23 2345 06/09/23 0336 06/09/23 0900 06/09/23 1230  BP: 117/69 111/73 119/74 105/74  Pulse: 99 76 88 78  Resp: 18 20 15 20   Temp: 98.1 F (36.7 C) 98 F (36.7 C) 98.1 F (36.7 C) 98 F (36.7 C)  TempSrc: Oral Oral Oral Oral  SpO2: 99% 100% 97% 98%  Weight:      Height:       General: alert, cooperative, and no distress Lochia: appropriate Uterine Fundus: firm Perineum: minimal edema/intact DVT Evaluation: No evidence of DVT seen on physical exam.  Labs: Lab Results  Component Value Date   WBC 15.3 (H) 06/09/2023   HGB 10.3 (L) 06/09/2023   HCT 31.2 (L) 06/09/2023   MCV 75.9 (L) 06/09/2023   PLT 292 06/09/2023      Latest Ref Rng & Units 09/20/2016    2:12 PM  CMP  Glucose 65 - 99 mg/dL 295   BUN 6 - 20 mg/dL 9   Creatinine 6.21 - 3.08 mg/dL 6.57   Sodium 846 - 962 mmol/L 141   Potassium 3.5 - 5.1 mmol/L 3.7   Chloride 101 - 111 mmol/L 108   CO2 22 - 32 mmol/L  28   Calcium 8.9 - 10.3 mg/dL 9.3   Total Protein 6.5 - 8.1 g/dL 7.6   Total Bilirubin 0.3 - 1.2 mg/dL 0.7   Alkaline Phos 38 - 126 U/L 49   AST 15 - 41 U/L 19   ALT 14 - 54 U/L 14    Edinburgh Score:    06/09/2023   10:45 AM  Edinburgh Postnatal Depression Scale Screening Tool  I have been able to laugh and see the funny side of things. 1  I have looked forward with enjoyment to things. 1  I have blamed myself unnecessarily when things went wrong. 2  I have been anxious or worried for no good reason. 2  I have felt scared or panicky for no good reason. 1  Things have been getting on  top of me. 2  I have been so unhappy that I have had difficulty sleeping. 1  I have felt sad or miserable. 1  I have been so unhappy that I have been crying. 1  The thought of harming myself has occurred to me. 0  Edinburgh Postnatal Depression Scale Total 12    Risk assessment for postpartum VTE and prophylactic treatment: Very high risk factors: None High risk factors: None Moderate risk factors: None  Postpartum VTE prophylaxis with LMWH not indicated  After visit meds:  Allergies as of 06/09/2023       Reactions   Latex    Venlafaxine Other (See Comments)   Too tired Too tired        Medication List     STOP taking these medications    famotidine 20 MG tablet Commonly known as: PEPCID       TAKE these medications    acetaminophen 325 MG tablet Commonly known as: Tylenol Take 2 tablets (650 mg total) by mouth every 6 (six) hours as needed for mild pain (pain score 1-3) or fever (for pain scale < 4).   benzocaine-Menthol 20-0.5 % Aero Commonly known as: DERMOPLAST Apply 1 Application topically as needed for irritation (perineal discomfort).   FLUoxetine 10 MG capsule Commonly known as: PROZAC Take 10 mg by mouth daily.   ibuprofen 600 MG tablet Commonly known as: ADVIL Take 1 tablet (600 mg total) by mouth every 6 (six) hours as needed for fever, mild pain (pain score 1-3) or cramping.   lamoTRIgine 200 MG tablet Commonly known as: LAMICTAL Take 200 mg by mouth daily.   lurasidone 80 MG Tabs tablet Commonly known as: LATUDA Take 120 mg by mouth daily with breakfast.   senna-docusate 8.6-50 MG tablet Commonly known as: Senokot-S Take 2 tablets by mouth at bedtime as needed for mild constipation.   witch hazel-glycerin pad Commonly known as: TUCKS Apply 1 Application topically as needed for hemorrhoids.       Discharge home in stable condition Infant Feeding: Breast Infant Disposition:home with mother Discharge instruction: per After Visit  Summary and Postpartum booklet. Activity: Advance as tolerated. Pelvic rest for 6 weeks.  Diet: routine diet Anticipated Birth Control: Nexplanon Postpartum Appointment:6 weeks Additional Postpartum F/U: Postpartum Depression checkup Future Appointments: Future Appointments  Date Time Provider Department Center  02/26/2024  3:45 PM Deirdre Evener, MD [facility] None   Follow up Visit:  Follow-up Information     [name], CNM Follow up in 2 week(s).   Specialty: Certified Nurse Midwife Why: 2wk mood check Contact information: [address] [place] [address] [phone]  [name], CNM Follow up in 6 week(s).   Specialty: Certified Nurse Midwife Why: 6wk postpartum Contact information: [address] [place] [address] [phone]                 Plan:  ASENETH HACK was discharged to home in good condition. Follow-up appointment as directed.    Signed:  [name], CNM 06/09/2023 7:21 PM

## 2023-06-08 NOTE — Discharge Summary (Signed)
Laura Pitts is a 28 y.o. female. She is at [redacted]w[redacted]d gestation. Patient's last menstrual period was 09/12/2022 (exact date). 06/19/2023, by Last Menstrual Period   Prenatal care site: [facility]  Chief complaint: contractions   Admission Diagnoses:  1) intrauterine pregnancy at [redacted]w[redacted]d  2) Uterine contractions during pregnancy [O47.9]  Discharge Diagnoses:  Principal Problem:   Uterine contractions during pregnancy   HPI: Laura Pitts presents to L&D with complaints of contractions that started around 10am yesterday morning. They now feel like they are about 5 minutes apart.  Her pregnancy is complicated by history of precipitous delivery, marijuana and nicotine use in pregnancy, depression and anxiety in pregnancy .  She denies Loss of fluid or Vaginal bleeding. Endorses fetal movement as active.   S: Resting comfortably.no VB.no LOF,  Active fetal movement.   Maternal Medical History:  Past Medical Hx:  has a past medical history of Anxiety, Bipolar affective disorder (HCC), Depression, and Migraine.    Past Surgical Hx:  has a past surgical history that includes No past surgeries; Intrauterine device (iud) insertion (11/07/12, 01/19/2015); and tubes in ears.   Allergies  Allergen Reactions   Latex    Venlafaxine Other (See Comments)    Too tired Too tired     Prior to Admission medications   Medication Sig Start Date End Date Taking? Authorizing Provider  lamoTRIgine (LAMICTAL) 200 MG tablet Take 200 mg by mouth daily.    [provider]    Social History: She  reports that she has been smoking cigarettes. She has never used smokeless tobacco. She reports current alcohol use. She reports current drug use. Drug: Marijuana.  Family History: family history includes Brain cancer in her maternal grandmother; Hypertension in her father; Lung cancer in her maternal grandfather; Migraines in her mother.   Review of Systems: A full review of systems was performed and  negative except as noted in the HPI.     Pertinent Results:  Prenatal Labs: Blood type/Rh A POS Performed at [facility], [address] [place], [place] [address]    Antibody screen Negative    Rubella Immune    Varicella Immune    RPR NR    HBsAg Neg   Hep C NR   HIV NR    GC neg  Chlamydia neg  Genetic screening cfDNA negative   1 hour GTT 87  3 hour GTT N/A  GBS Neg       O:  BP 127/76   Pulse 88   LMP 09/12/2022 (Exact Date)  No results found for this or any previous visit (from the past 48 hours).   Constitutional: NAD, AAOx3  PULM: nl respiratory effort Abd: gravid, non-tender Ext: Non-tender, Nonedmeatous Psych: mood appropriate, speech normal SVE: Dilation: 3 Effacement (%): 60 Cervical Position: Posterior Station: -1 Presentation: Vertex Exam by:: RRL    NST: Baseline FHR: 135 beats/min Variability: moderate Accelerations: present Decelerations: absent Tocometry: Mild contractions 5-10 minutes  Time: at least 20 minutes   Interpretation: Category I INDICATIONS: rule out uterine contractions RESULTS:  A NST procedure was performed with FHR monitoring and a normal baseline established, appropriate time of 20-40 minutes of evaluation, and accels >2 seen w 15x15 characteristics.  Results show a REACTIVE NST.   Consults: None  Procedures: NST  Hospital Course: The patient was admitted to Labor and Delivery Triage for observation. Cervical exam was unchanged after 2 hours. Discussed staying an additional 2-4 hours for monitoring d/t personal history of  precipitous delivery.  Laura Pitts declines at this time and feels comfortable going home. States that she lives close to the hospital and will return if anything changes. Strict labor precautions reviewed.   She was deemed stable for discharge and further outpatient management.   Plan: 1) Reactive NST  -Category 1 tracing  -Reassuring fetal status   2) Labor: Not present  -Discussed warning  signs to return to L&D triage with  -Reviewed comfort measures   Discharge Condition: stable  Disposition: Discharge disposition: 01-Home or Self Care        Allergies as of 06/08/2023       Reactions   Latex    Venlafaxine Other (See Comments)   Too tired Too tired        Medication List     TAKE these medications    lamoTRIgine 200 MG tablet Commonly known as: LAMICTAL Take 200 mg by mouth daily.        Follow-up Information     [facility] Follow up on 06/13/2023.   Why: routine prenatal appointment Contact information: [address] [place] [address] [phone]               ----- Gustavo Lah, CNM  Certified Nurse Midwife [facility]

## 2023-06-08 NOTE — Discharge Instructions (Signed)
LABOR: When contractions begin, you should start to time them from the beginning of one contraction to the beginning of the next.  When contractions are 5-7 minutes apart or less and have been regular for at least an hour, you should call your health care provider.  Notify your doctor if any of the following occur: 1. Bleeding from the vagina 7. Sudden, constant, or occasional abdominal pain  2. Pain or burning when urinating 8. Sudden gushing of fluid from the vagina (with or without continued leaking)  3. Chills or fever 9. Fainting spells, "black outs" or loss of consciousness  4. Increase in vaginal discharge 10. Severe or continued nausea or vomiting  5. Pelvic pressure (sudden increase) 11. Blurring of vision or spots before the eyes  6. Baby moving less than usual 12. Leaking of fluid    FETAL KICK COUNT: Lie on your left side for one hour after a meal, and count the number of times your baby kicks. If it is less than 5 times, get up, move around and drink some juice. Repeat the test 30 minutes later. If it is still less than 5 kicks in an hour, notify your doctor.

## 2023-06-08 NOTE — H&P (Signed)
OB History & Physical   History of Present Illness:  Chief Complaint:   HPI:  Laura Pitts is a 28 y.o. G2P2002 female at [redacted]w[redacted]d dated by LMP.  She presents to L&D for active labor and uterine contractions since 11pm last night.  Pregnancy Issues: 1. Hx of precipitous delivery 2. Mild anemia 3. Marijuana use in pregnancy 4. Smoking in pregnancy 5. Anxiety/depression   Maternal Medical History:   Past Medical History:  Diagnosis Date   Anxiety    Bipolar affective disorder (HCC)    Depression    Migraine     Past Surgical History:  Procedure Laterality Date   INTRAUTERINE DEVICE (IUD) INSERTION  11/07/12, 01/19/2015   Skyla   NO PAST SURGERIES     tubes in ears      Allergies  Allergen Reactions   Latex    Venlafaxine Other (See Comments)    Too tired Too tired    Prior to Admission medications   Medication Sig Start Date End Date Taking? Authorizing Provider  lamoTRIgine (LAMICTAL) 200 MG tablet Take 200 mg by mouth daily.    [provider]   Prenatal care site: [facility]   Social History: She  reports that she has been smoking cigarettes. She has never used smokeless tobacco. She reports current alcohol use. She reports current drug use. Drug: Marijuana.  Family History: family history includes Brain cancer in her maternal grandmother; Hypertension in her father; Lung cancer in her maternal grandfather; Migraines in her mother.   Review of Systems: A full review of systems was performed and negative except as noted in the HPI.     Physical Exam:  Vital Signs: BP 124/72 (BP Location: Left Arm)   Pulse 99   Temp 98.8 F (37.1 C) (Oral)   Resp 20   LMP 09/12/2022 (Exact Date)  General: no acute distress.  HEENT: normocephalic, atraumatic Heart: regular rate & rhythm.  No murmurs/rubs/gallops Lungs: clear to auscultation bilaterally, normal respiratory effort Abdomen: soft, gravid, non-tender;  EFW: 7.5lb Pelvic:   External:  Normal external female genitalia  Cervix: Dilation: 6.5 /   /      Extremities: non-tender, symmetric, mild edema bilaterally.  DTRs: +2  Neurologic: Alert & oriented x 3.    No results found for this or any previous visit (from the past 24 hours).  Pertinent Results:  Prenatal Labs: Blood type/Rh A pos  Antibody screen neg  Rubella Immune  Varicella Immune  RPR NR  HBsAg Neg  HIV NR  GC neg  Chlamydia neg  Genetic screening negative  1 hour GTT 87  3 hour GTT   GBS Negative   FHT: 140bpm, moderate variability, no accelerations present yet, no decelerations TOCO: contractions q2-23min, palpate moderate SVE:  Dilation: 6.5 /   /      Cephalic by leopolds  No results found.  Assessment:  Laura Pitts is a 28 y.o. G49P2002 female at [redacted]w[redacted]d with active labor.   Plan:  1. Admit to Labor & Delivery; consents reviewed and obtained - Dr. Dalbert Garnet notified of admission  2. Fetal Well being  - Fetal Tracing: Category I tracing - Group B Streptococcus ppx indicated: n/a, GBS negative - Presentation: vertex confirmed by SVE   3. Routine OB: - Prenatal labs reviewed, as above - Rh positive - CBC, T&S, RPR on admit - Clear fluids, saline lock  4. Monitoring of Labor  -  Contractions q2-35min, external toco in place -  Pelvis  proven to 3460g, adequate for trial of labor -  Plan for augmentation with AROM as needed -  Plan for continuous fetal monitoring  -  Maternal pain control as desired; undecided about pain management in labor - Anticipate vaginal delivery  5. Post Partum Planning: - Infant feeding: breastfeeding - Contraception: BTL (consent signed 04/18/23) - Tdap: given prenatally - Flu: declined - RSV: give prenatally  6. Hx of marijuana use in pregnancy: - UDS ordered  [name], CNM 06/08/23 6:17 PM

## 2023-06-08 NOTE — OB Triage Note (Signed)
Patient is G3P2 is [redacted]w[redacted]d is seen at [facility] clinic. Patient c/o of CTX since 1030pm . Patient rates them a 6/10. Hx of precipitous delivery. Denies LOF or discharge at this time. FHR 135 good variability. VSS. Provider to be notified.

## 2023-06-08 NOTE — Addendum Note (Signed)
Addended by: Gustavo Lah on: 06/08/2023 08:04 AM   Modules accepted: Orders

## 2023-06-08 NOTE — OB Triage Note (Signed)
Patient arrived in triage with complaints of contractions about every 3-5 mins. Reports some blood noted in mucous. Good fetal movement noted. Denies leaking of fluid consistent with ROM. Monitors applied and assessing.

## 2023-06-09 LAB — CBC
HCT: 31.2 % — ABNORMAL LOW (ref 36.0–46.0)
Hemoglobin: 10.3 g/dL — ABNORMAL LOW (ref 12.0–15.0)
MCH: 25.1 pg — ABNORMAL LOW (ref 26.0–34.0)
MCHC: 33 g/dL (ref 30.0–36.0)
MCV: 75.9 fL — ABNORMAL LOW (ref 80.0–100.0)
Platelets: 292 10*3/uL (ref 150–400)
RBC: 4.11 MIL/uL (ref 3.87–5.11)
RDW: 14.2 % (ref 11.5–15.5)
WBC: 15.3 10*3/uL — ABNORMAL HIGH (ref 4.0–10.5)
nRBC: 0 % (ref 0.0–0.2)

## 2023-06-09 LAB — RPR: RPR Ser Ql: NONREACTIVE

## 2023-06-09 MED ORDER — IBUPROFEN 600 MG PO TABS: 600.0000 mg | ORAL_TABLET | Freq: Four times a day (QID) | ORAL | 0 refills | Status: AC | PRN

## 2023-06-09 MED ORDER — WITCH HAZEL-GLYCERIN EX PADS: 1.0000 | MEDICATED_PAD | CUTANEOUS | 0 refills | Status: AC | PRN

## 2023-06-09 MED ORDER — ACETAMINOPHEN 325 MG PO TABS: 650.0000 mg | ORAL_TABLET | Freq: Four times a day (QID) | ORAL | 0 refills | Status: AC | PRN

## 2023-06-09 MED ORDER — HYDROXYZINE HCL 25 MG PO TABS
25.0000 mg | ORAL_TABLET | Freq: Four times a day (QID) | ORAL | Status: DC | PRN
  Administered 2023-06-09: 25 mg via ORAL
  Filled 2023-06-09: qty 1

## 2023-06-09 MED ORDER — BENZOCAINE-MENTHOL 20-0.5 % EX AERO: 1.0000 | INHALATION_SPRAY | CUTANEOUS | 0 refills | Status: AC | PRN

## 2023-06-09 MED ORDER — SENNOSIDES-DOCUSATE SODIUM 8.6-50 MG PO TABS: 2.0000 | ORAL_TABLET | Freq: Every evening | ORAL | 0 refills | Status: AC | PRN

## 2023-06-09 NOTE — Lactation Note (Signed)
This note was copied from a baby's chart. Lactation Consultation Note  Patient Name: Laura Pitts MVHQI'O Date: 06/09/2023 Age:29 hours    Lactation Note  Mom is an experienced breastfeeding mom. She declines LC assistance at this time. LC number on white board. Encouraged mom to call LC if she has any lactation questions or concerns.  Update provided to care nurse.  Fuller Song 06/09/2023, 2:36 PM

## 2023-06-09 NOTE — Progress Notes (Signed)
Post Partum Day 1 Subjective: Doing well, only complaint is mild back pain since delivery.  Tolerating regular diet, pain with PO meds, voiding and ambulating without difficulty.  No CP SOB Fever,Chills, N/V or leg pain; denies nipple or breast pain, no HA change of vision, RUQ/epigastric pain  Objective: BP 119/74 (BP Location: Left Arm)   Pulse 88   Temp 98.1 F (36.7 C) (Oral)   Resp 15   Ht 5\' 5"  (1.651 m)   Wt 61.2 kg   LMP 09/12/2022 (Exact Date)   SpO2 97%   Breastfeeding Unknown   BMI 22.47 kg/m    Physical Exam:  General: NAD Breasts: soft/nontender  CV: RRR Pulm: nl effort, CTABL Abdomen: soft, NT, BS x 4 Perineum:  minimal edema, intact  Lochia: moderate Uterine Fundus: fundus firm and 1 fb below umbilicus DVT Evaluation: no cords, ttp LEs   Recent Labs    06/08/23 1821 06/09/23 0553  HGB 10.6* 10.3*  HCT 32.2* 31.2*  WBC 12.1* 15.3*  PLT 306 292    Assessment/Plan: 28 y.o. G3P3003 postpartum day # 1  - Continue routine PP care - Lactation consult PRN  - Discussed contraceptive options including implant, IUDs hormonal and non-hormonal, injection, pills/ring/patch, condoms, and NFP.  - Acute blood loss anemia, clinically insignificant - hemoglobin changed from 10.6 to 10.3, patient is asymptomatic, hemodynamically stable; continue po ferrous sulfate BID with stool softeners - Immunization status: all Imms up to date  Disposition: Desires Dc home today. Will discharge this evening at 24hrs postpartum if infant is also able to be discharged.  [name], CNM 06/09/2023 9:54 AM

## 2023-06-09 NOTE — Clinical Social Work Maternal (Signed)
CLINICAL SOCIAL WORK MATERNAL/CHILD NOTE  Patient Details  Name: Laura Pitts MRN: [id number] Date of Birth: 12-Jan-1996  Date:  06/09/2023  Clinical Social Worker Initiating Note:  Laura Pitts, Laura Pitts Date/Time: Initiated:  06/09/23/1230     Child's Name:  Laura Pitts   Biological Parents:  Mother, Father   Need for Interpreter:  None   Reason for Referral:  Other (Comment) [name] Scale)   Address:  [address] [place] [address]    Phone number:  [phone] (home)     Additional phone number:   Household Members/Support Persons (HM/SP):   Household Member/Support Person 3, Household Member/Support Person 1, Household Member/Support Person 2   HM/SP Name Relationship DOB or Age  HM/SP -1 Laura Pitts Daughter 10  HM/SP -2 Laura Pitts Daughter 2  HM/SP -3 Laura Pitts Laura 03/18/1991  HM/SP -4        HM/SP -5        HM/SP -6        HM/SP -7        HM/SP -8          Natural Supports (not living in the home):  Immediate Family   Professional Supports: Paramedic, Other (Comment) Tax inspector)   Employment: Full-time   Type of Work: Oncologist   Education:  Some Materials engineer arranged:    Surveyor, quantity Resources:  Medicaid   Other Resources:  [facility]   Cultural/Religious Considerations Which May Impact Care:    Strengths:  Ability to meet basic needs     Psychotropic Medications:         Pediatrician:    [employer]   Pediatrician List:   [facility] [place]    [facility]      Pediatrician Fax Number: [phone]  Risk Factors/Current Problems:  None   Cognitive State:  Alert     Mood/Affect:  Calm     CSW Assessment:  CSW received a consult for?[name]'s Edinburgh Postnatal Depression Scale results   CSW spoke with [name] and explained CSW's role and reason for referral.   [name] reported she is hanging in their?post-delivery. [name] was  alert,?appropriate?during the assessment.    CSW confirmed contact information for [name]. [phone])   [name] and Laura will be residing at the same residence at discharge. (Laura, Wendy Pitts, DOB: 03/18/1991). [name] reports she has two daughters Laura Pitts who is 70 years old and Laura Pitts who is 29 years old.    [name] reported DSS took her food stamps away because they claim she didn't list Laura on the paperwork. [name] stated the [facility] office told her to call back once she has the baby. [name] stated she had WIC with her 25-year-old Laura Pitts. [name] stated she plans on using [facility] in [place] for her son Laura Pitts). [name] reported she has a crib,?bassinet, car seat (new), clothing, diapers, and all other items needed for baby. [name] reported she has?a history of depression. [name] states she has a Therapist, sports with [employer] in [place]. [name] also sees Verta Ellen with [employer] in [place]. [name] stated she has plenty of support from Laura and family if she feels overwhelmed. [name] disclosed that the anniversary of her father's death is coming up. [name] reported feeling sad but okay. CSW discussed added support from family during this difficult time. [name] voiced understanding of receiving help from family when sad or overwhelmed. Laura also  agreed to provide support during this time. [name] denied SI, HI, or DV. [name] said she will continue to follow-up with her doctor if she is feeling out of place. [name] denied any need for mental health resources at this time.    CSW provided education and information sheets on PPD and SIDS. [name] verbalized understanding. CSW encouraged [name] to reach out to her Provider with any questions or needs for support or resources, even after discharge. [name] denied any needs or questions. CSW encouraged [name] to reach out if any arise prior to discharge.    CSW Plan/Description:  No Further Intervention Required/No Barriers to Discharge    Laura Pitts, LCSWA 06/09/2023,  1:09 PM

## 2023-06-09 NOTE — Progress Notes (Signed)
Patient d/c home with infant. D/c instructions, Rx, and f/u appt given to and reviewed with pt. Pt verbalized understanding. Escorted out by staff.

## 2023-09-25 ENCOUNTER — Ambulatory Visit: Admitting: Physical Therapy

## 2023-10-02 ENCOUNTER — Ambulatory Visit: Admitting: Physical Therapy

## 2023-10-10 ENCOUNTER — Ambulatory Visit: Admitting: Physical Therapy

## 2023-10-17 NOTE — Therapy (Incomplete)
 OUTPATIENT PHYSICAL THERAPY LOWER EXTREMITY EVALUATION   Patient Name: Laura Pitts MRN: [id number] DOB:Aug 23, 1995, 28 y.o., female Today's Date: 10/17/2023  END OF SESSION: ***   Past Medical History:  Diagnosis Date   Anxiety    Bipolar affective disorder (HCC)    Depression    Migraine    Past Surgical History:  Procedure Laterality Date   INTRAUTERINE DEVICE (IUD) INSERTION  11/07/12, 01/19/2015   Skyla   NO PAST SURGERIES     tubes in ears     Patient Active Problem List   Diagnosis Date Noted   Uterine contractions during pregnancy 06/08/2023   Depression affecting pregnancy 06/08/2023   Anemia affecting pregnancy in third trimester 06/08/2023   Anxiety during pregnancy 06/08/2023   Uterine contractions 06/08/2023   NSVD (normal spontaneous vaginal delivery) 06/08/2023   History of precipitous delivery 06/08/2023   History of precipitous labor and delivery 05/21/2023   Marijuana use during pregnancy 11/27/2022   Supervision of normal pregnancy 11/02/2022   Medication exposure during first trimester of pregnancy 03/17/2020   B12 deficiency 09/24/2016   MDD (major depressive disorder), recurrent episode, severe (HCC) 09/21/2016   PTSD (post-traumatic stress disorder) 09/21/2016   Tobacco use disorder 09/21/2016   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  Rationale for Evaluation and Treatment: Rehabilitation  ONSET DATE: ***  SUBJECTIVE:   SUBJECTIVE STATEMENT:  ***  Sciatic pain, worse on her left side, comes and goes, makes it difficult to walk, leg sometimes buckles from the pain.   Postpartum, with vaginal delivery on 06/08/2023,     PERTINENT HISTORY: *** PMH: anxiety, depression affecting pregnancy, chronic migraine, vitamin B12  deficiency, chondromalacia of patella, low back pain, PTSD, MDD (major depressive disorder), Irregular periods/menstrual cycles, Vasovagal syncope, Marijuana use during pregnancy, heart palpitations PAIN:  Are you having pain? {OPRCPAIN:27236}  PRECAUTIONS: {Therapy precautions:24002}  RED FLAGS: {PT Red Flags:29287}   WEIGHT BEARING RESTRICTIONS: {Yes ***/No:24003}  FALLS:  Has patient fallen in last 6 months? {fallsyesno:27318}  LIVING ENVIRONMENT: Lives with: {OPRC lives with:25569::"lives with their family"} Lives in: {Lives in:25570} Stairs: {opstairs:27293} Has following equipment at home: {Assistive devices:23999}  OCCUPATION: ***  PLOF: {PLOF:24004}  PATIENT GOALS: ***  NEXT MD VISIT: ***  OBJECTIVE:  Note: Objective measures were completed at Evaluation unless otherwise noted.  DIAGNOSTIC FINDINGS: ***  PATIENT SURVEYS:  {rehab surveys:24030}  COGNITION: Overall cognitive status: {cognition:24006}     SENSATION: {sensation:27233}  EDEMA:  {edema:24020}  MUSCLE LENGTH: Hamstrings: Right *** deg; Left *** deg Andy Bannister test: Right *** deg; Left *** deg  POSTURE: {posture:25561}  PALPATION: ***  LOWER EXTREMITY ROM:  {AROM/PROM:27142} ROM Right eval Left eval  Hip flexion    Hip extension    Hip abduction    Hip adduction    Hip internal rotation    Hip external rotation    Knee flexion    Knee extension    Ankle dorsiflexion    Ankle plantarflexion    Ankle inversion    Ankle eversion     (Blank rows = not tested)  LOWER EXTREMITY MMT:  MMT Right eval Left eval  Hip flexion    Hip extension    Hip abduction    Hip adduction    Hip internal rotation    Hip external rotation  Knee flexion    Knee extension    Ankle dorsiflexion    Ankle plantarflexion    Ankle inversion    Ankle eversion     (Blank rows = not tested)  LOWER EXTREMITY SPECIAL TESTS:  {LEspecialtests:26242}  FUNCTIONAL TESTS:  {Functional  tests:24029}  GAIT: Distance walked: *** Assistive device utilized: {Assistive devices:23999} Level of assistance: {Levels of assistance:24026} Comments: ***                                                                                                                                TREATMENT DATE: 10/18/2023   ***      PATIENT EDUCATION:  Education details: *** Person educated: {Person educated:25204} Education method: {Education Method:25205} Education comprehension: {Education Comprehension:25206}  HOME EXERCISE PROGRAM: ***  ASSESSMENT:  CLINICAL IMPRESSION: Patient is a 28 y.o. female who was seen today for physical therapy evaluation and treatment for ***.   OBJECTIVE IMPAIRMENTS: {opptimpairments:25111}.   ACTIVITY LIMITATIONS: {activitylimitations:27494}  PARTICIPATION LIMITATIONS: {participationrestrictions:25113}  PERSONAL FACTORS: {Personal factors:25162} are also affecting patient's functional outcome.   REHAB POTENTIAL: {rehabpotential:25112}  CLINICAL DECISION MAKING: {clinical decision making:25114}  EVALUATION COMPLEXITY: {Evaluation complexity:25115}   GOALS: Goals reviewed with patient? Yes  SHORT TERM GOALS: Target date: *** *** Baseline: Goal status: INITIAL   LONG TERM GOALS: Target date: ***  *** Baseline:  Goal status: INITIAL  2.  *** Baseline:  Goal status: INITIAL  3.  *** Baseline:  Goal status: INITIAL  4.  *** Baseline:  Goal status: INITIAL  5.  *** Baseline:  Goal status: INITIAL  6.  *** Baseline:  Goal status: INITIAL   PLAN:  PT FREQUENCY: 1-2x/week  PT DURATION: 12 weeks  PLANNED INTERVENTIONS: {rehab planned interventions:25118::"97110-Therapeutic exercises","97530- Therapeutic [phone]- Neuromuscular re-education","97535- Self BMWU","13244- Manual therapy"}  PLAN FOR NEXT SESSION: ***    Chesni Vos, PT, DPT, NCS, CSRS Physical Therapist - [facility]  6:28 PM 10/17/23

## 2023-10-18 ENCOUNTER — Ambulatory Visit: Attending: Certified Nurse Midwife | Admitting: Physical Therapy

## 2023-10-22 ENCOUNTER — Ambulatory Visit

## 2023-11-01 ENCOUNTER — Encounter: Admitting: Physical Therapy

## 2023-11-05 ENCOUNTER — Encounter: Admitting: Physical Therapy

## 2023-11-14 ENCOUNTER — Encounter

## 2023-11-21 ENCOUNTER — Encounter: Admitting: Physical Therapy

## 2023-11-28 ENCOUNTER — Encounter: Admitting: Physical Therapy

## 2023-12-05 ENCOUNTER — Encounter: Admitting: Physical Therapy

## 2023-12-12 ENCOUNTER — Encounter: Admitting: Physical Therapy

## 2023-12-19 ENCOUNTER — Encounter: Admitting: Physical Therapy

## 2023-12-26 ENCOUNTER — Encounter: Admitting: Physical Therapy

## 2024-01-02 ENCOUNTER — Encounter: Admitting: Physical Therapy

## 2024-01-09 ENCOUNTER — Encounter: Admitting: Physical Therapy

## 2024-02-26 ENCOUNTER — Ambulatory Visit: Payer: Medicaid Other | Admitting: Dermatology

## 2024-03-02 ENCOUNTER — Ambulatory Visit (INDEPENDENT_AMBULATORY_CARE_PROVIDER_SITE_OTHER)

## 2024-03-02 DIAGNOSIS — L508 Other urticaria: Secondary | ICD-10-CM

## 2024-03-02 NOTE — Progress Notes (Signed)
    Subjective   Laura Pitts is a 28 y.o. female who presents for the following: Rash. Patient is new patient  Today patient reports: Pt states that when she has anxiety she itches all over and scratches so much that she bruises. Her last outbreak was two weeks, and the older she's become the worse it has become. Was worse with her last pregnancy, and he son is now 17 mths old. Pt currently taking hydroxyzine  25 mg po QD which helps some. She is currently breast feeding.  Review of Systems:    No other skin or systemic complaints except as noted in HPI or Assessment and Plan.  The following portions of the chart were reviewed this encounter and updated as appropriate: medications, allergies, medical history  Relevant Medical History:  Anxiety and depression  Objective  Well appearing patient in no apparent distress; mood and affect are within normal limits. Examination was performed of the: Focused Exam of: the face and arms   Examination notable for: mild dermatographism. No active eruption on exam, no photos.  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 Condition is symptomatic and bothersome to patient. Patient is flaring and not currently at treatment goal.  - Explained the process of cholinergic urticaria that can be associated with overheating, overstimulation - D/C Hydroxyzine  25 mg QHS - Initiating cetirizine 10 mg (OTC) 1-3 pills daily as tolerated.   - Advised can use up to 4x the recommended dose of antihistamines - Stressed the importance of taking these pills even when not itching.  - Encouraged avoidance of exacerbating factors (e.g. Heat) - Can consider Allergy referral, omalizumab, dupixent in the future  - Consider rhapsido (remibrutinib)  - Pt to send photo when flare occurs via MyChart.  Procedures, orders, diagnosis for this visit:     There are no diagnoses linked to this encounter.  Return to clinic: Return if symptoms worsen or fail to improve.  Laura Pitts, CMA, am acting as scribe for Laura JAYSON Kanaris, MD .  Documentation: I have reviewed the above documentation for accuracy and completeness, and I agree with the above.  Laura JAYSON Kanaris, MD

## 2024-03-02 NOTE — Patient Instructions (Addendum)
 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 Condition is symptomatic and bothersome to patient. Patient is flaring and not currently at treatment goal.  - Explained the process of cholinergic urticaria that can be associated with overheating, overstimulation - Discontinue Hydroxyzine  25 mg - Initiating cetirizine 10 mg (OTC) 1-3 pills daily as tolerated.   - Advised can use up to 4x the recommended dose of antihistamines - Stressed the importance of taking these pills even when not itching.  - Encouraged avoidance of exacerbating factors (e.g. Heat) - Can consider Allergy referral, omalizumab, dupixent in the future  - Consider rhapsido (remibrutinib)    Due to recent changes in healthcare laws, you may see results of your pathology and/or laboratory studies on MyChart before the doctors have had a chance to review them. We understand that in some cases there may be results that are confusing or concerning to you. Please understand that not all results are received at the same time and often the doctors may need to interpret multiple results in order to provide you with the best plan of care or course of treatment. Therefore, we ask that you please give us  2 business days to thoroughly review all your results before contacting the office for clarification. Should we see a critical lab result, you will be contacted sooner.   If You Need Anything After Your Visit  If you have any questions or concerns for your doctor, please call our main line at [phone] and press option 4 to reach your doctor's medical assistant. If no one answers, please leave a voicemail as directed and we will return your call as soon as possible. Messages left after 4 pm will be answered the following business day.   You may also send us  a message via MyChart. We typically respond to MyChart messages within 1-2 business days.  For prescription refills,  please ask your pharmacy to contact our office. Our fax number is [phone].  If you have an urgent issue when the clinic is closed that cannot wait until the next business day, you can page your doctor at the number below.    Please note that while we do our best to be available for urgent issues outside of office hours, we are not available 24/7.   If you have an urgent issue and are unable to reach us , you may choose to seek medical care at your doctor's office, retail clinic, urgent care center, or emergency room.  If you have a medical emergency, please immediately call 911 or go to the emergency department.  Pager Numbers  - Dr. Hester: [phone]  - Dr. Jackquline: [phone]  - Dr. Claudene: [phone]   - Dr. Raymund: [phone]  In the event of inclement weather, please call our main line at [phone] for an update on the status of any delays or closures.  Dermatology Medication Tips: Please keep the boxes that topical medications come in in order to help keep track of the instructions about where and how to use these. Pharmacies typically print the medication instructions only on the boxes and not directly on the medication tubes.   If your medication is too expensive, please contact our office at [phone] option 4 or send us  a message through MyChart.   We are unable to tell what your co-pay for medications will be in advance as this is different depending on your insurance coverage. However, we may be able to find a substitute medication at  lower cost or fill out paperwork to get insurance to cover a needed medication.   If a prior authorization is required to get your medication covered by your insurance company, please allow us  1-2 business days to complete this process.  Drug prices often vary depending on where the prescription is filled and some pharmacies may offer cheaper prices.  The website www.goodrx.com contains coupons for medications through  different pharmacies. The prices here do not account for what the cost may be with help from insurance (it may be cheaper with your insurance), but the website can give you the price if you did not use any insurance.  - You can print the associated coupon and take it with your prescription to the pharmacy.  - You may also stop by our office during regular business hours and pick up a GoodRx coupon card.  - If you need your prescription sent electronically to a different pharmacy, notify our office through [facility] or by phone at [phone] option 4.     Si Usted Necesita Algo Despus de Su Visita  Tambin puede enviarnos un mensaje a travs de Clinical cytogeneticist. Por lo general respondemos a los mensajes de MyChart en el transcurso de 1 a 2 das hbiles.  Para renovar recetas, por favor pida a su farmacia que se ponga en contacto con nuestra oficina. Randi lakes de fax es [place] [phone].  Si tiene un asunto urgente cuando la clnica est cerrada y que no puede esperar hasta el siguiente da hbil, puede llamar/localizar a su doctor(a) al nmero que aparece a continuacin.   Por favor, tenga en cuenta que aunque hacemos todo lo posible para estar disponibles para asuntos urgentes fuera del horario de [place], no estamos disponibles las 24 horas del da, los 7 [address]  [address] de la [place].   Si tiene un problema urgente y no puede comunicarse con nosotros, puede optar por buscar atencin mdica  en el consultorio de su doctor(a), en una clnica privada, en un centro de atencin urgente o en una sala de emergencias.  Si tiene Engineer, drilling, por favor llame inmediatamente al 911 o vaya a la sala de emergencias.  Nmeros de bper  - Dr. Hester: [phone]  - Dra. Jackquline: [phone]  - Dr. Claudene: [phone]  - Dra. Kitts: [phone]  En caso de inclemencias del [place], por favor llame a nuestra lnea principal al [phone] para una actualizacin sobre el estado de cualquier  retraso o cierre.  Consejos para la medicacin en dermatologa: Por favor, guarde las cajas en las que vienen los medicamentos de uso tpico para ayudarle a seguir las instrucciones sobre dnde y cmo usarlos. Las farmacias generalmente imprimen las instrucciones del medicamento slo en las cajas y no directamente en los tubos del [place].   Si su medicamento es muy caro, por favor, pngase en contacto con landry rieger llamando al [phone] y presione la opcin 4 o envenos un mensaje a travs de Clinical cytogeneticist.   No podemos decirle cul ser su copago por los medicamentos por adelantado ya que esto es diferente dependiendo de la cobertura de su seguro. Sin embargo, es posible que podamos encontrar un medicamento sustituto a Audiological scientist un formulario para que el seguro cubra el medicamento que se considera necesario.   Si se requiere una autorizacin previa para que su compaa de seguros malta su medicamento, por favor permtanos de 1 a 2 das hbiles para completar este proceso.  Los precios de los medicamentos varan con frecuencia dependiendo  del lugar de dnde se surte la receta y alguna farmacias pueden ofrecer precios ms baratos.  El sitio web www.goodrx.com tiene cupones para medicamentos de Health and safety inspector. Los precios aqu no tienen en cuenta lo que podra costar con la ayuda del seguro (puede ser ms barato con su seguro), pero el sitio web puede darle el precio si no utiliz Tourist information centre manager.  - Puede imprimir el cupn correspondiente y llevarlo con su receta a la farmacia.  - Tambin puede pasar por nuestra oficina durante el horario de atencin regular y Education officer, museum una tarjeta de cupones de GoodRx.  - Si necesita que su receta se enve electrnicamente a una farmacia diferente, informe a nuestra oficina a travs de MyChart de [place] o por telfono llamando al [phone] y presione la opcin 4.
